# Patient Record
Sex: Male | Born: 1970 | Race: White | Hispanic: No | Marital: Single | Smoking: Former smoker
Health system: Southern US, Community
[De-identification: ages and names within clinical notes are randomized; demographics above are authoritative.]

## PROBLEM LIST (undated history)

## (undated) DIAGNOSIS — Z87891 Personal history of nicotine dependence: Secondary | ICD-10-CM

## (undated) DIAGNOSIS — G4730 Sleep apnea, unspecified: Secondary | ICD-10-CM

## (undated) DIAGNOSIS — T7840XA Allergy, unspecified, initial encounter: Secondary | ICD-10-CM

## (undated) DIAGNOSIS — F411 Generalized anxiety disorder: Secondary | ICD-10-CM

## (undated) DIAGNOSIS — R509 Fever, unspecified: Secondary | ICD-10-CM

## (undated) HISTORY — DX: Fever, unspecified: R50.9

## (undated) HISTORY — PX: WISDOM TOOTH EXTRACTION: SHX21

## (undated) HISTORY — DX: Generalized anxiety disorder: F41.1

## (undated) HISTORY — DX: Allergy, unspecified, initial encounter: T78.40XA

## (undated) HISTORY — DX: Personal history of nicotine dependence: Z87.891

## (undated) HISTORY — DX: Sleep apnea, unspecified: G47.30

## (undated) HISTORY — PX: SKIN LESION EXCISION: SHX2412

---

## 2002-07-19 ENCOUNTER — Encounter: Payer: Self-pay | Admitting: Family Medicine

## 2002-07-19 ENCOUNTER — Encounter: Admission: RE | Admit: 2002-07-19 | Discharge: 2002-07-19 | Payer: Self-pay | Admitting: Family Medicine

## 2007-01-10 ENCOUNTER — Ambulatory Visit: Payer: Self-pay | Admitting: Family Medicine

## 2007-07-19 ENCOUNTER — Ambulatory Visit: Payer: Self-pay | Admitting: Family Medicine

## 2007-10-20 ENCOUNTER — Ambulatory Visit: Payer: Self-pay | Admitting: Family Medicine

## 2008-04-30 ENCOUNTER — Ambulatory Visit: Payer: Self-pay | Admitting: Family Medicine

## 2008-05-16 ENCOUNTER — Ambulatory Visit: Payer: Self-pay | Admitting: Family Medicine

## 2008-11-26 ENCOUNTER — Ambulatory Visit: Payer: Self-pay | Admitting: Family Medicine

## 2009-01-01 ENCOUNTER — Ambulatory Visit: Payer: Self-pay | Admitting: Family Medicine

## 2009-04-08 ENCOUNTER — Ambulatory Visit: Payer: Self-pay | Admitting: Family Medicine

## 2010-03-24 ENCOUNTER — Ambulatory Visit: Payer: Self-pay | Admitting: Physician Assistant

## 2010-04-01 ENCOUNTER — Ambulatory Visit: Payer: Self-pay | Admitting: Family Medicine

## 2010-05-01 ENCOUNTER — Ambulatory Visit: Payer: Self-pay | Admitting: Family Medicine

## 2010-07-15 ENCOUNTER — Ambulatory Visit: Payer: Self-pay | Admitting: Family Medicine

## 2010-09-03 ENCOUNTER — Ambulatory Visit: Payer: Self-pay | Admitting: Family Medicine

## 2010-09-09 ENCOUNTER — Ambulatory Visit: Payer: Self-pay | Admitting: Physician Assistant

## 2010-09-24 ENCOUNTER — Ambulatory Visit: Payer: Self-pay | Admitting: Family Medicine

## 2010-10-09 ENCOUNTER — Ambulatory Visit: Payer: Self-pay | Admitting: Family Medicine

## 2010-12-02 ENCOUNTER — Ambulatory Visit: Payer: Self-pay | Admitting: Family Medicine

## 2011-03-30 ENCOUNTER — Ambulatory Visit (INDEPENDENT_AMBULATORY_CARE_PROVIDER_SITE_OTHER): Payer: 59 | Admitting: Family Medicine

## 2011-03-30 DIAGNOSIS — J309 Allergic rhinitis, unspecified: Secondary | ICD-10-CM

## 2011-03-30 DIAGNOSIS — R5381 Other malaise: Secondary | ICD-10-CM

## 2011-03-30 DIAGNOSIS — J029 Acute pharyngitis, unspecified: Secondary | ICD-10-CM

## 2011-04-20 ENCOUNTER — Ambulatory Visit (INDEPENDENT_AMBULATORY_CARE_PROVIDER_SITE_OTHER): Payer: 59 | Admitting: Medical

## 2011-04-20 DIAGNOSIS — J029 Acute pharyngitis, unspecified: Secondary | ICD-10-CM

## 2011-04-20 DIAGNOSIS — L258 Unspecified contact dermatitis due to other agents: Secondary | ICD-10-CM

## 2011-04-20 DIAGNOSIS — R109 Unspecified abdominal pain: Secondary | ICD-10-CM

## 2011-05-13 ENCOUNTER — Encounter: Payer: Self-pay | Admitting: Family Medicine

## 2011-05-28 ENCOUNTER — Ambulatory Visit (INDEPENDENT_AMBULATORY_CARE_PROVIDER_SITE_OTHER): Payer: 59 | Admitting: Medical

## 2011-05-28 ENCOUNTER — Encounter: Payer: Self-pay | Admitting: Medical

## 2011-05-28 VITALS — BP 130/98 | HR 88 | Temp 98.8°F | Ht 66.0 in | Wt 196.0 lb

## 2011-05-28 DIAGNOSIS — R21 Rash and other nonspecific skin eruption: Secondary | ICD-10-CM

## 2011-05-28 NOTE — Progress Notes (Signed)
  Subjective:   HPI  Gary Garrett is a 40 y.o. male who presents for rash on penis x 1 days.  He notes recent oral sex received from partner.  He has also shaved in the pubic region recently.  He notes yesterday felt a discomfort in the same area, but no pain or burning.  Then later yesterday noticed a round scaly lesion. He is worried for genital herpes, so he wanted to get this checked out.  He denies penile discharge currently.  No other aggravating or relieving factors.  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  He has seen specialist regarding the HPV and has had treatments for this.  No other c/o.  The following portions of the patient's history were reviewed and updated as appropriate: allergies, current medications, past family history, past medical history, past social history, past surgical history and problem list.  Review of Systems Constitutional: denies fever, chills, sweats, unexpected weight change, anorexia, fatigue Gastroenterology: denies abdominal pain, nausea, vomiting, diarrhea, constipation Musculoskeletal: denies arthralgias, myalgias, joint swelling, back pain Urology: denies dysuria, difficulty urinating, hematuria, urinary frequency, urgency     Objective:   Physical Exam  General appearance: alert, no distress, WD/WN,  Abdomen: no inguinal lymphadenopathy GU: right proximal end of penis with small 2mm round, sightly scaly round erythema lesion.  No vesicles present, no drainage.  There is slight patch of erythema, but given his recent shaving in the area, I suspect irritation vs actual viral process.   Assessment :    Encounter Diagnosis  Name Primary?  . Rash Yes    Plan:    Viral culture swab sent.  Advised that history and exam don't really favor herpes, but swab today to help eval.  Etiology most likely scab from small break in the skin where he shave in the pubic region.   Dicussed symptoms/signs of herpes, recheck if rash worsens or if he gets  similar rash or worse in same area again. For now can use OTC triple antibiotic, use condoms in general, but no sexual activity until this resolves completely.

## 2011-05-28 NOTE — Patient Instructions (Signed)
We will call with lab results   

## 2011-06-01 ENCOUNTER — Telehealth: Payer: Self-pay | Admitting: *Deleted

## 2011-06-01 LAB — HERPES SIMPLEX VIRUS CULTURE: Organism ID, Bacteria: NOT DETECTED

## 2011-06-01 NOTE — Telephone Encounter (Addendum)
Message copied by Dorthula Perfect on Mon Jun 01, 2011  4:56 PM ------      Message from: Aleen Campi, DAVID S      Created: Mon Jun 01, 2011  3:43 PM       pls call and advise that swab showed NO herpes.  Hopefully his rash has resolved by now.   Pt notified of swab results.  Rash has not resolved and pt states " I am getting in a tub of hot water with salt and soaking and it feels better."  Advised pt that if that makes the rash feel better to do just that.  Pt agreed.  CM, LPN

## 2011-06-02 ENCOUNTER — Encounter: Payer: Self-pay | Admitting: Medical

## 2011-06-02 ENCOUNTER — Ambulatory Visit: Payer: 59 | Admitting: Medical

## 2011-06-02 ENCOUNTER — Ambulatory Visit (INDEPENDENT_AMBULATORY_CARE_PROVIDER_SITE_OTHER): Payer: 59 | Admitting: Medical

## 2011-06-02 VITALS — BP 120/80 | HR 75 | Temp 98.5°F | Ht 72.0 in | Wt 194.0 lb

## 2011-06-02 DIAGNOSIS — L739 Follicular disorder, unspecified: Secondary | ICD-10-CM

## 2011-06-02 DIAGNOSIS — L738 Other specified follicular disorders: Secondary | ICD-10-CM

## 2011-06-02 DIAGNOSIS — L678 Other hair color and hair shaft abnormalities: Secondary | ICD-10-CM

## 2011-06-02 MED ORDER — MUPIROCIN 2 % EX OINT: TOPICAL_OINTMENT | CUTANEOUS | Status: DC

## 2011-06-02 NOTE — Progress Notes (Signed)
Subjective:   HPI  Gary Garrett is a 40 y.o. male who presents for rash on penis and scrotum. I saw him on 05/28/11 for similar. He initially came in with concern for rash on penis worrisome for herpes.  Viral culture came back positive.  He does shave in the genital region, and I advised that he most likely had a folliculitis at that time.  He has been doing Epsom salt soaks, and the 2 initial lesions are much improved but not completely resolved. He has a new complaint of a white bump on his left scrotum. The bump is not painful or itchy. He is just worried it may be something contagious.  No other aggravating or relieving factors.  No other c/o.  The following portions of the patient's history were reviewed and updated as appropriate: allergies, current medications, past family history, past medical history, past social history, past surgical history and problem list.   Review of Systems Constitutional: denies fever, chills, sweats, unexpected weight change, anorexia, fatigue Gastroenterology: denies abdominal pain, nausea, vomiting, diarrhea, constipation Musculoskeletal: denies arthralgias, myalgias, joint swelling, back pain Urology: denies dysuria, difficulty urinating, hematuria, urinary frequency, urgency     Objective:   Physical Exam  General appearance: alert, no distress, WD/WN,  GU: right base of penis with round erythematous 2mm lesion, similar crusted lesion on right upper inner thigh.  Left scrotum with whitish pearly papule.  Otherwise nontender, no other lesions, no lymphadenopathy, no discharge.   Assessment :    Encounter Diagnosis  Name Primary?  . Folliculitis Yes    Plan:   Advised to use mupirocin ointment on the 2 reddish lesions. These are resolving somewhat. Advised that the left scrotal lesion is benign and nothing to worry about. Reassured.  Discussed signs of STDs, worrisome genital rashes.  Advises safe sex.

## 2011-06-05 ENCOUNTER — Ambulatory Visit: Payer: 59 | Admitting: Medical

## 2011-07-27 ENCOUNTER — Encounter: Payer: Self-pay | Admitting: Medical

## 2011-07-27 ENCOUNTER — Ambulatory Visit (INDEPENDENT_AMBULATORY_CARE_PROVIDER_SITE_OTHER): Payer: 59 | Admitting: Medical

## 2011-07-27 DIAGNOSIS — W1800XA Striking against unspecified object with subsequent fall, initial encounter: Secondary | ICD-10-CM

## 2011-07-27 DIAGNOSIS — W1809XA Striking against other object with subsequent fall, initial encounter: Secondary | ICD-10-CM

## 2011-07-27 DIAGNOSIS — H9319 Tinnitus, unspecified ear: Secondary | ICD-10-CM

## 2011-07-27 NOTE — Progress Notes (Signed)
Subjective:   HPI  Gary Garrett is a 40 y.o. male who presents for c/o fall.  He was vacuuming carpet last Wednesday, 6 days ago, when he got tangled between the cough and some other furniture.  He ended up falling sideways and hit the right side of his face and body against the arm rest of the couch.  He denies LOC, headache, confusion, but ever since the fall, he has had ringing in the right ear and feeling of not being able to equalize pressure in the right ear.  He has been chewing gum and swallowing often to equalize pressure.  No other aggravating or relieving factors.  No other c/o.  The following portions of the patient's history were reviewed and updated as appropriate: allergies, current medications, past family history, past medical history, past social history, past surgical history and problem list.  Past Medical History  Diagnosis Date  . Allergy   . Asthma   . GAD (generalized anxiety disorder)    Review of Systems Constitutional: denies fever, chills, sweats Dermatology: denies rash, bruising, redness ENT: no runny nose, ear pain, sore throat, hoarseness, sinus pain Cardiology: denies chest pain, palpitations Respiratory: denies cough, shortness of breath Gastroenterology: denies abdominal pain, nausea, vomiting, no loss of bowels Musculoskeletal: denies arthralgias, myalgias, joint swelling, back pain Ophthalmology: denies vision changes Urology: denies dysuria,no loss of bladder function Neurology: no headache, weakness, tingling, numbness, confusion, amnesia     Objective:   Physical Exam  General appearance: alert, no distress, WD/WN, white male HEENT: normocephalic, sclerae anicteric, PERRLA, EOMi, TMs and canals normal, nares patent, no discharge or erythema, pharynx normal Oral cavity: MMM, no lesions Neck: supple, no lymphadenopathy, no thyromegaly, no masses, normal ROM Heart: RRR, normal S1, S2, no murmurs Lungs: CTA bilaterally, no wheezes, rhonchi, or  rales Extremities: no edema, no cyanosis, no clubbing Pulses: 2+ symmetric, upper and lower extremities, normal cap refill Neurological: alert, oriented x 3, CN2-12 intact, strength normal upper extremities and lower extremities, sensation normal throughout, DTRs 2+ throughout, no cerebellar signs, gait normal Psychiatric: normal affect, behavior normal, pleasant    Assessment :    Encounter Diagnoses  Name Primary?  . Tinnitus Yes  . Fall against object      Plan:   Based on exam and symptoms today, I don't suspect intracranial bleed or concussion.   Advised he rest, including keeping stimuli to minimal the next few days, avoid sudden movements, use OTC Aleve and Dramamine the next few days, and see if symptoms don't gradually resolve.  Advised if new symptoms - worse headache of life, vision changes, numbness, weakness, pupil asymmetry, confusion, etc., to call/return/or go to the ED.  Advised if not some better by end of the week to call back.

## 2011-07-28 ENCOUNTER — Encounter: Payer: Self-pay | Admitting: Family Medicine

## 2011-09-30 ENCOUNTER — Ambulatory Visit (INDEPENDENT_AMBULATORY_CARE_PROVIDER_SITE_OTHER): Payer: 59 | Admitting: Medical

## 2011-09-30 ENCOUNTER — Encounter: Payer: Self-pay | Admitting: Medical

## 2011-09-30 ENCOUNTER — Other Ambulatory Visit: Payer: Self-pay | Admitting: Medical

## 2011-09-30 DIAGNOSIS — F17200 Nicotine dependence, unspecified, uncomplicated: Secondary | ICD-10-CM

## 2011-09-30 DIAGNOSIS — Z113 Encounter for screening for infections with a predominantly sexual mode of transmission: Secondary | ICD-10-CM

## 2011-09-30 DIAGNOSIS — F411 Generalized anxiety disorder: Secondary | ICD-10-CM

## 2011-09-30 DIAGNOSIS — J029 Acute pharyngitis, unspecified: Secondary | ICD-10-CM

## 2011-09-30 DIAGNOSIS — Z23 Encounter for immunization: Secondary | ICD-10-CM

## 2011-09-30 DIAGNOSIS — Z0000 Encounter for general adult medical examination without abnormal findings: Secondary | ICD-10-CM | POA: Insufficient documentation

## 2011-09-30 DIAGNOSIS — F419 Anxiety disorder, unspecified: Secondary | ICD-10-CM | POA: Insufficient documentation

## 2011-09-30 LAB — CBC WITH DIFFERENTIAL/PLATELET
Basophils Absolute: 0 10*3/uL (ref 0.0–0.1)
Basophils Relative: 1 % (ref 0–1)
Eosinophils Absolute: 0.1 10*3/uL (ref 0.0–0.7)
Eosinophils Relative: 3 % (ref 0–5)
HCT: 42.6 % (ref 39.0–52.0)
Lymphocytes Relative: 39 % (ref 12–46)
Lymphs Abs: 1.5 10*3/uL (ref 0.7–4.0)
MCH: 29.2 pg (ref 26.0–34.0)
MCHC: 32.4 g/dL (ref 30.0–36.0)
MCV: 90.3 fL (ref 78.0–100.0)
Monocytes Absolute: 0.5 10*3/uL (ref 0.1–1.0)
Monocytes Relative: 13 % — ABNORMAL HIGH (ref 3–12)
Platelets: 247 10*3/uL (ref 150–400)
RBC: 4.72 MIL/uL (ref 4.22–5.81)
RDW: 13.7 % (ref 11.5–15.5)
WBC: 3.9 10*3/uL — ABNORMAL LOW (ref 4.0–10.5)

## 2011-09-30 LAB — POCT URINALYSIS DIPSTICK
Blood, UA: NEGATIVE
Glucose, UA: NEGATIVE
Ketones, UA: NEGATIVE
Spec Grav, UA: 1.015
Urobilinogen, UA: NEGATIVE

## 2011-09-30 NOTE — Patient Instructions (Signed)
Preventative Care for Adults, Male       REGULAR HEALTH EXAMS:  A routine yearly physical is a good way to check in with your primary care provider about your health and preventive screening. It is also an opportunity to share updates about your health and any concerns you have, and receive a thorough all-over exam.   Most health insurance companies pay for at least some preventative services.  Check with your health plan for specific coverages.  WHAT PREVENTATIVE SERVICES DO MEN NEED?  Adult men should have their weight and blood pressure checked regularly.   Men age 35 and older should have their cholesterol levels checked regularly.  Beginning at age 50 and continuing to age 75, men should be screened for colorectal cancer.  Certain people should may need continued testing until age 85.  Other cancer screening may include exams for testicular and prostate cancer.  Updating vaccinations is part of preventative care.  Vaccinations help protect against diseases such as the flu.  Lab tests are generally done as part of preventative care to screen for anemia and blood disorders, to screen for problems with the kidneys and liver, to screen for bladder problems, to check blood sugar, and to check your cholesterol level.  Preventative services generally include counseling about diet, exercise, avoiding tobacco, drugs, excessive alcohol consumption, and sexually transmitted infections.    GENERAL RECOMMENDATIONS FOR GOOD HEALTH:  Healthy diet:  Eat a variety of foods, including fruit, vegetables, animal or vegetable protein, such as meat, fish, chicken, and eggs, or beans, lentils, tofu, and grains, such as rice.  Drink plenty of water daily.  Decrease saturated fat in the diet, avoid lots of red meat, processed foods, sweets, fast foods, and fried foods.  Exercise:  Aerobic exercise helps maintain good heart health. At least 30-40 minutes of moderate-intensity exercise is recommended.  For example, a brisk walk that increases your heart rate and breathing. This should be done on most days of the week.   Find a type of exercise or a variety of exercises that you enjoy so that it becomes a part of your daily life.  Examples are running, walking, swimming, water aerobics, and biking.  For motivation and support, explore group exercise such as aerobic class, spin class, Zumba, Yoga,or  martial arts, etc.    Set exercise goals for yourself, such as a certain weight goal, walk or run in a race such as a 5k walk/run.  Speak to your primary care provider about exercise goals.  Disease prevention:  If you smoke or chew tobacco, find out from your caregiver how to quit. It can literally save your life, no matter how long you have been a tobacco user. If you do not use tobacco, never begin.   Maintain a healthy diet and normal weight. Increased weight leads to problems with blood pressure and diabetes.   The Body Mass Index or BMI is a way of measuring how much of your body is fat. Having a BMI above 27 increases the risk of heart disease, diabetes, hypertension, stroke and other problems related to obesity. Your caregiver can help determine your BMI and based on it develop an exercise and dietary program to help you achieve or maintain this important measurement at a healthful level.  High blood pressure causes heart and blood vessel problems.  Persistent high blood pressure should be treated with medicine if weight loss and exercise do not work.   Fat and cholesterol leaves deposits in your arteries   that can block them. This causes heart disease and vessel disease elsewhere in your body.  If your cholesterol is found to be high, or if you have heart disease or certain other medical conditions, then you may need to have your cholesterol monitored frequently and be treated with medication.   Ask if you should have a stress test if your history suggests this. A stress test is a test done on  a treadmill that looks for heart disease. This test can find disease prior to there being a problem.  Avoid drinking alcohol in excess (more than two drinks per day).  Avoid use of street drugs. Do not share needles with anyone. Ask for professional help if you need assistance or instructions on stopping the use of alcohol, cigarettes, and/or drugs.  Brush your teeth twice a day with fluoride toothpaste, and floss once a day. Good oral hygiene prevents tooth decay and gum disease. The problems can be painful, unattractive, and can cause other health problems. Visit your dentist for a routine oral and dental check up and preventive care every 6-12 months.   Look at your skin regularly.  Use a mirror to look at your back. Notify your caregivers of changes in moles, especially if there are changes in shapes, colors, a size larger than a pencil eraser, an irregular border, or development of new moles.  Safety:  Use seatbelts 100% of the time, whether driving or as a passenger.  Use safety devices such as hearing protection if you work in environments with loud noise or significant background noise.  Use safety glasses when doing any work that could send debris in to the eyes.  Use a helmet if you ride a bike or motorcycle.  Use appropriate safety gear for contact sports.  Talk to your caregiver about gun safety.  Use sunscreen with a SPF (or skin protection factor) of 15 or greater.  Lighter skinned people are at a greater risk of skin cancer. Don't forget to also wear sunglasses in order to protect your eyes from too much damaging sunlight. Damaging sunlight can accelerate cataract formation.   Practice safe sex. Use condoms. Condoms are used for birth control and to help reduce the spread of sexually transmitted infections (or STIs).  Some of the STIs are gonorrhea (the clap), chlamydia, syphilis, trichomonas, herpes, HPV (human papilloma virus) and HIV (human immunodeficiency virus) which causes AIDS.  The herpes, HIV and HPV are viral illnesses that have no cure. These can result in disability, cancer and death.   Keep carbon monoxide and smoke detectors in your home functioning at all times. Change the batteries every 6 months or use a model that plugs into the wall.   Vaccinations:  Stay up to date with your tetanus shots and other required immunizations. You should have a booster for tetanus every 10 years. Be sure to get your flu shot every year, since 5%-20% of the [place] population comes down with the flu. The flu vaccine changes each year, so being vaccinated once is not enough. Get your shot in the fall, before the flu season peaks.   Other vaccines to consider:  Pneumococcal vaccine to protect against certain types of pneumonia.  This is normally recommended for adults age 65 or older.  However, adults younger than 40 years old with certain underlying conditions such as diabetes, heart or lung disease should also receive the vaccine.  Shingles vaccine to protect against Varicella Zoster if you are older than age 60, or younger   than 40 years old with certain underlying illness.  Hepatitis A vaccine to protect against a form of infection of the liver by a virus acquired from food.  Hepatitis B vaccine to protect against a form of infection of the liver by a virus acquired from blood or body fluids, particularly if you work in health care.  If you plan to travel internationally, check with your local health department for specific vaccination recommendations.  Cancer Screening:  Most routine colon cancer screening begins at the age of 50. On a yearly basis, doctors may provide special easy to use take-home tests to check for hidden blood in the stool. Sigmoidoscopy or colonoscopy can detect the earliest forms of colon cancer and is life saving. These tests use a small camera at the end of a tube to directly examine the colon. Speak to your caregiver about this at age 50, when routine  screening begins (and is repeated every 5 years unless early forms of pre-cancerous polyps or small growths are found).   At the age of 50 men usually start screening for prostate cancer every year. Screening may begin at a younger age for those with higher risk. Those at higher risk include African-Americans or having a family history of prostate cancer. There are two types of tests for prostate cancer:   Prostate-specific antigen (PSA) testing. Recent studies raise questions about prostate cancer using PSA and you should discuss this with your caregiver.   Digital rectal exam (in which your doctor's lubricated and gloved finger feels for enlargement of the prostate through the anus).   Screening for testicular cancer.  Do a monthly exam of your testicles. Gently roll each testicle between your thumb and fingers, feeling for any abnormal lumps. The best time to do this is after a hot shower or bath when the tissues are looser. Notify your caregivers of any lumps, tenderness or changes in size or shape immediately.     

## 2011-09-30 NOTE — Progress Notes (Signed)
Subjective:   HPI  Gary Garrett is a 40 y.o. male who presents for a complete physical.  He has been doing well in general.  He notes last tetanus booster within the last 10 years at [facility].  He wants flu shot today.  He has hx/o Hep A and Hep B vaccinations.  He wants STD screening today.  He does have 29 male sexual partners and has had hx/o +gonorrhea throat culture.  Has had recent sore throat, but does have hx/o allergies.  He also notes hx/o genital warts surgically removed by [facility].   Hx/o hemorrhoids and fissure.  No other c/o.  He does deal with anxiety.  Has used Lexapro in the past without improvement.  Declines medication today.    Reviewed their medical, surgical, family, social, medication, and allergy history and updated chart as appropriate.  Past Medical History  Diagnosis Date  . Allergy   . GAD (generalized anxiety disorder)   . Asthma     allergen induced asthma  . 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 No pertinent past surgical history.  Family History  Problem Relation Age of Onset  . Hypertension Paternal Grandmother   . Heart disease Paternal Grandfather     died in 33s of heart disease  . Stroke Neg Hx   . Diabetes Neg Hx   . Cancer Other     great grandmother lung cancer    History   Social History  . Marital Status: Single    Spouse Name: N/A    Number of Children: N/A  . Years of Education: N/A   Occupational History  . landscape architecture    Social History Main Topics  . Smoking status: Current Some Day Smoker -- 0.2 packs/day  . Smokeless tobacco: Never Used  . Alcohol Use: 0.0 oz/week    0 drink(s) per week  . Drug Use: No  . Sexually Active: Not on file   Other Topics Concern  . Not on file   Social History Narrative   Lives at home by himself, single.  Exercise - not much.   Has multiple sex partners, men, uses condoms with anal intercourse.    Current  Outpatient Prescriptions on File Prior to Visit  Medication Sig Dispense Refill  . Ascorbic Acid (VITAMIN C) 1000 MG tablet Take 1,000 mg by mouth daily.          No Known Allergies   Review of Systems Constitutional: denies fever, chills, sweats, unexpected weight change, anorexia, fatigue Allergy: negative; denies recent sneezing, itching, congestion Dermatology: denies changing moles, rash, lumps, new worrisome lesions ENT: +irritated throat; no runny nose, ear pain, hoarseness, sinus pain, teeth pain, tinnitus, hearing loss, epistaxis Cardiology: +occasional palpitation with anxiety/panic attack; denies chest pain, edema, orthopnea, paroxysmal nocturnal dyspnea Respiratory: denies cough, shortness of breath, dyspnea on exertion, wheezing, hemoptysis Gastroenterology: denies abdominal pain, nausea, vomiting, diarrhea, constipation, blood in stool, changes in bowel movement, dysphagia Hematology: denies bleeding or bruising problems Musculoskeletal: denies arthralgias, myalgias, joint swelling, back pain, neck pain, cramping, gait changes Ophthalmology: denies vision changes, eye redness, itching, discharge Urology: +urinary irritation; denies dysuria, difficulty urinating, hematuria, incontinence Neurology: no headache, weakness, tingling, numbness, speech abnormality, memory loss, falls, dizziness Psychology: +anxiety; denies depressed mood, sleep problems     Objective:   Physical Exam  Filed Vitals:   09/30/11 0824  BP: 110/80  Pulse: 62  Temp: 98 F (  36.7 C)  Resp: 16    General appearance: alert, no distress, WD/WN, white male Skin: left upper arm laterally with large patch of brown/pink flat macular lesions that he notes is scar from prior poison ivy dermatitis, scattered benign appearing macules on back and arms HEENT: normocephalic, conjunctiva/corneas normal, sclerae anicteric, PERRLA, EOMi, nares patent, no discharge or erythema, pharynx normal Oral cavity: MMM,  tongue normal, teeth in good repair Neck: supple, no lymphadenopathy, no thyromegaly, no masses, normal ROM, no bruits Chest: non tender, normal shape and expansion Heart: RRR, normal S1, S2, no murmurs Lungs: CTA bilaterally, no wheezes, rhonchi, or rales Abdomen: +bs, soft, non tender, non distended, no masses, no hepatomegaly, no splenomegaly, no bruits Back: non tender, normal ROM, no scoliosis Musculoskeletal: upper extremities non tender, no obvious deformity, normal ROM throughout, lower extremities non tender, no obvious deformity, normal ROM throughout Extremities: no edema, no cyanosis, no clubbing Pulses: 2+ symmetric, upper and lower extremities, normal cap refill Neurological: alert, oriented x 3, CN2-12 intact, strength normal upper extremities and lower extremities, sensation normal throughout, DTRs 2+ throughout, no cerebellar signs, gait normal Psychiatric: normal affect, behavior normal, pleasant  GU: normal male external genitalia, no mass, no lesions, no lymphadenopathy Anus: normal appearing, no hemorrhoid or lesion   Assessment :    Encounter Diagnoses  Name Primary?  . General medical examination Yes  . Screen for STD (sexually transmitted disease)   . Anxiety   . Tobacco use disorder   . Pharyngitis   . Need for prophylactic vaccination and inoculation against influenza      Plan:    Physical exam - discussed healthy lifestyle, diet, exercise, preventative care, vaccinations, and addressed their concerns.    STD screening - high risk sexual behavior.  Labs sent. Discussed safe sex, use of condoms.    Anxiety - discussed concerns, ways to deal with anxiety.  Advised regular exercise.    Tobacco use disorder - discussed risks/benefits of tobacco use.  Advised he stop tobacco.  Pharyngitis - likely due to allergens.  Begin OTC Zyrtec.  Given his sexual contacts with oral sex and hx/o gonorrhea + on prior throat culture, we will screen today for gonorrhea,  chlamydia, and throat culture.  Flu vaccine and VIS today.

## 2011-10-01 LAB — COMPREHENSIVE METABOLIC PANEL
AST: 16 U/L (ref 0–37)
Albumin: 4.8 g/dL (ref 3.5–5.2)
Alkaline Phosphatase: 46 U/L (ref 39–117)
CO2: 25 mEq/L (ref 19–32)
Chloride: 108 mEq/L (ref 96–112)
Creat: 0.84 mg/dL (ref 0.50–1.35)
Glucose, Bld: 93 mg/dL (ref 70–99)
Potassium: 4.4 mEq/L (ref 3.5–5.3)
Sodium: 141 mEq/L (ref 135–145)
Total Bilirubin: 1 mg/dL (ref 0.3–1.2)

## 2011-10-01 LAB — HIV ANTIBODY (ROUTINE TESTING W REFLEX): HIV: NONREACTIVE

## 2011-10-01 LAB — LIPID PANEL
HDL: 38 mg/dL — ABNORMAL LOW (ref >39)
LDL Cholesterol: 91 mg/dL (ref 0–99)
Total CHOL/HDL Ratio: 3.7 Ratio
Triglycerides: 64 mg/dL (ref <150)
VLDL: 13 mg/dL (ref 0–40)

## 2011-10-01 LAB — GC/CHLAMYDIA PROBE AMP, URINE: Chlamydia, Swab/Urine, PCR: NEGATIVE

## 2011-10-03 LAB — GONOCOCCUS CULTURE: Organism ID, Bacteria: NO GROWTH

## 2012-03-18 ENCOUNTER — Encounter: Payer: Self-pay | Admitting: Medical

## 2012-03-18 ENCOUNTER — Ambulatory Visit (INDEPENDENT_AMBULATORY_CARE_PROVIDER_SITE_OTHER): Payer: 59 | Admitting: Medical

## 2012-03-18 VITALS — BP 114/80 | HR 84 | Temp 98.6°F | Resp 16 | Wt 201.0 lb

## 2012-03-18 DIAGNOSIS — J029 Acute pharyngitis, unspecified: Secondary | ICD-10-CM

## 2012-03-18 LAB — POCT RAPID STREP A (OFFICE): Rapid Strep A Screen: NEGATIVE

## 2012-03-18 NOTE — Progress Notes (Signed)
Subjective:  Gary Garrett is a 41 y.o. male who presents for 1 week hx/o sore throat.  He reports sore throat ranging from mild to moderate pain, some irritated eyes, mild cough, was around some pollen in [place] earlier in the week.  Using nothing for symptoms.  He has not had a recent close exposure to someone with proven streptococcal pharyngitis.  Past Medical History  Diagnosis Date  . Allergy   . GAD (generalized anxiety disorder)   . Asthma     allergen induced asthma  . 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  Oral cavity: MMM, no lesions  Neck: supple, no lymphadenopathy, no thyromegaly Heart: RRR, normal S1, S2, no murmurs Lungs: CTA bilaterally, no wheezes, rhonchi, or rales  Laboratory Strep test done. Results:negative.    Assessment and Plan:   Encounter Diagnoses  Name Primary?  . Pharyngitis Yes  . Sore throat     Advised that symptoms and exam suggest viral etiology.  Discussed symptomatic treatment including salt water gargles, warm fluids, rest, hydrate well, can use over-the-counter Tylenol or Ibuprofen for throat pain, fever, or malaise. If worse or not improving within 2-3 days, call or return.

## 2012-03-18 NOTE — Patient Instructions (Signed)
Warm fluids, salt water gargles, honey/lemon mixture, sore throat spray OTC, Zyrtec 10mg  at bedtime daily (OTC), and Ibuprofen 200mg  OTC, use 2-3 tablets every 6 hours as needed for throat pain over the next few days.

## 2012-03-25 ENCOUNTER — Other Ambulatory Visit: Payer: Self-pay | Admitting: Medical

## 2012-03-25 ENCOUNTER — Telehealth: Payer: Self-pay | Admitting: Medical

## 2012-03-25 MED ORDER — AZITHROMYCIN 500 MG PO TABS: 500.0000 mg | ORAL_TABLET | Freq: Every day | ORAL | Status: AC

## 2012-03-25 NOTE — Telephone Encounter (Signed)
Pt states the he came in last Friday and saw you.  Pt states that he is some better. He does state that his throat is still just raw and causing him a lot of discomfort. Please call pt. [facility] uses [facility] [address].

## 2012-03-25 NOTE — Telephone Encounter (Signed)
Given the ongoing throat pain, lets try antibiotic for possible infection.   I sent Azithromycin, one daily for 3 days.

## 2012-03-25 NOTE — Telephone Encounter (Signed)
Lmom notifying the patient that a antibotic was sent to the pharmacy. CLS

## 2012-04-06 ENCOUNTER — Ambulatory Visit (INDEPENDENT_AMBULATORY_CARE_PROVIDER_SITE_OTHER): Payer: 59 | Admitting: Medical

## 2012-04-06 ENCOUNTER — Encounter: Payer: Self-pay | Admitting: Medical

## 2012-04-06 VITALS — BP 118/80 | HR 76 | Temp 97.8°F | Resp 16 | Wt 201.0 lb

## 2012-04-06 DIAGNOSIS — T148 Other injury of unspecified body region: Secondary | ICD-10-CM

## 2012-04-06 DIAGNOSIS — L089 Local infection of the skin and subcutaneous tissue, unspecified: Secondary | ICD-10-CM

## 2012-04-06 DIAGNOSIS — W57XXXA Bitten or stung by nonvenomous insect and other nonvenomous arthropods, initial encounter: Secondary | ICD-10-CM

## 2012-04-06 DIAGNOSIS — T148XXA Other injury of unspecified body region, initial encounter: Secondary | ICD-10-CM

## 2012-04-06 MED ORDER — DOXYCYCLINE HYCLATE 100 MG PO TABS: 100.0000 mg | ORAL_TABLET | Freq: Two times a day (BID) | ORAL | Status: AC
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
## 2012-04-19 ENCOUNTER — Telehealth (INDEPENDENT_AMBULATORY_CARE_PROVIDER_SITE_OTHER): Payer: Self-pay | Admitting: Surgery

## 2012-04-21 ENCOUNTER — Encounter (INDEPENDENT_AMBULATORY_CARE_PROVIDER_SITE_OTHER): Payer: Self-pay | Admitting: Surgery

## 2012-04-26 ENCOUNTER — Ambulatory Visit (INDEPENDENT_AMBULATORY_CARE_PROVIDER_SITE_OTHER): Payer: 59 | Admitting: Surgery

## 2012-04-26 ENCOUNTER — Encounter (INDEPENDENT_AMBULATORY_CARE_PROVIDER_SITE_OTHER): Payer: Self-pay | Admitting: Surgery

## 2012-04-26 VITALS — BP 122/84 | HR 88 | Temp 97.8°F | Resp 16 | Ht 72.0 in | Wt 198.8 lb

## 2012-04-26 DIAGNOSIS — L290 Pruritus ani: Secondary | ICD-10-CM | POA: Insufficient documentation

## 2012-04-26 NOTE — Progress Notes (Signed)
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  He was last seen in October 2011.  For some time he has been completely asymptomatic.  However, as the weather has warmed up, he has developed more itching and "moisture" in the perianal region.  No masses palpated.  No melena or hematochezia.  Occasional blood on toilet paper.  No significant pain.  He is only using some over the counter ointments.  Filed Vitals:   04/26/12 1559  BP: 122/84  Pulse: 88  Temp: 97.8 F (36.6 C)  Resp: 16    WDWN in NAD Rectal - no sign of any external condyloma lesions. No erythema, induration, excoriation No fissure, hemorrhoids, or fistula Essentially normal perirectal examination Normal sphincter tone No internal hemorrhoids on DRE  Imp:  Pruritus ani with normal rectal examination Recs:  Keep the perianal skin as dry as possible - minimize the use of ointments Use fiber supplements to maintain the consistency of the bowel movements Follow-up PRN.  Wilmon Arms. Corliss Skains, MD, [facility]  04/26/2012 4:59 PM

## 2012-05-11 ENCOUNTER — Encounter (INDEPENDENT_AMBULATORY_CARE_PROVIDER_SITE_OTHER): Payer: Self-pay | Admitting: Surgery

## 2012-07-08 ENCOUNTER — Ambulatory Visit (INDEPENDENT_AMBULATORY_CARE_PROVIDER_SITE_OTHER): Payer: 59 | Admitting: Medical

## 2012-07-08 ENCOUNTER — Encounter: Payer: Self-pay | Admitting: Medical

## 2012-07-08 VITALS — BP 124/80 | HR 74 | Temp 97.2°F | Resp 16 | Wt 203.0 lb

## 2012-07-08 DIAGNOSIS — J029 Acute pharyngitis, unspecified: Secondary | ICD-10-CM

## 2012-07-08 DIAGNOSIS — L0100 Impetigo, unspecified: Secondary | ICD-10-CM

## 2012-07-08 DIAGNOSIS — R21 Rash and other nonspecific skin eruption: Secondary | ICD-10-CM

## 2012-07-08 MED ORDER — DIPHENHYD-HYDROCORT-NYSTATIN MT SUSP: 10.0000 mL | Freq: Four times a day (QID) | OROMUCOSAL | Status: DC

## 2012-07-08 MED ORDER — MUPIROCIN 2 % EX OINT: TOPICAL_OINTMENT | CUTANEOUS | Status: DC

## 2012-07-08 MED ORDER — TRIAMCINOLONE ACETONIDE 0.1 % EX CREA: TOPICAL_CREAM | Freq: Two times a day (BID) | CUTANEOUS | Status: DC

## 2012-07-08 NOTE — Progress Notes (Signed)
Subjective: Here for multiple c/o.   Has rash under right arm for about a month.  Has been using hydrocortisone cream and neosporin, but rash won't go away.   Angelique Blonder any other exposure, no contacts with similar rash.  Only has rash under right armpit.   He reports a week and a half of throat irritation.  Wants some [employer] Magic Mouthwash that he uses occasional to gargle.  Says this helps.  He works at [employer], around plants all the time.  He c/o sore in left nostril, starting to clear up.  ROS as noted above in HPI  Objective: Gen: wd, wn, nad Heent: TMs pearly, left nare with small round erythematous and crusting lesion, otherwise no lesions, pharynx with mild erythema, some mucous drainage Neck: nontneder, no lymphadenopathy, no mass Skin: right axillar with scattered patches of erythema, no drainage, induration, fluctuance or warmth, nonspecific rash  Assessment: Encounter Diagnoses  Name Primary?  . Rash and nonspecific skin eruption Yes  . Pharyngitis   . Impetigo    Plan: Rash - suspect neomycin/neosporin allergy.  Stop neosporin, begin Triamcinolone cream, call if not resolving.  Pharyngitis - salt water gargles, warm fluids, begin back on zyrtec for a few weeks.  Magic mouthwash at his request  Impetigo in nose - mupirocin script.  return prn.

## 2012-07-22 ENCOUNTER — Other Ambulatory Visit: Payer: Self-pay | Admitting: Medical

## 2012-07-22 ENCOUNTER — Ambulatory Visit (INDEPENDENT_AMBULATORY_CARE_PROVIDER_SITE_OTHER): Payer: 59 | Admitting: Medical

## 2012-07-22 ENCOUNTER — Encounter: Payer: Self-pay | Admitting: Medical

## 2012-07-22 VITALS — BP 120/78 | HR 80 | Temp 98.5°F | Resp 16 | Wt 206.0 lb

## 2012-07-22 DIAGNOSIS — J029 Acute pharyngitis, unspecified: Secondary | ICD-10-CM

## 2012-07-22 DIAGNOSIS — J349 Unspecified disorder of nose and nasal sinuses: Secondary | ICD-10-CM

## 2012-07-22 DIAGNOSIS — H109 Unspecified conjunctivitis: Secondary | ICD-10-CM

## 2012-07-22 DIAGNOSIS — J329 Chronic sinusitis, unspecified: Secondary | ICD-10-CM

## 2012-07-22 MED ORDER — DOXYCYCLINE HYCLATE 100 MG PO TABS: 100.0000 mg | ORAL_TABLET | Freq: Two times a day (BID) | ORAL | Status: AC

## 2012-07-22 NOTE — Progress Notes (Signed)
Subjective: Here for c/o sore throat.   Saw me a few weeks ago for the same.  Still using Zyrtec but not helping much.  He currently reports sore throat, fatigue, no energy, eyes feel like a film of pollen is on them.  Otherwise no other symptoms.  He does report new sexual contact a few weeks ago, performed oral sex and he wants to make sure he doesn't have an STD causing the sore throat.    Past Medical History  Diagnosis Date  . Allergy   . GAD (generalized anxiety disorder)   . Asthma     allergen induced asthma  . 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 Tick bite of buttock 04/06/12   ROS Gen: no fever, chills, weight loss Eyes: itching bilat Skin: no rash Heent: sinus pressure, runny nose, sneezing, sore throat Lungs: no SOB or wheezing Heart: no CP, palpations GI: negative GU : negative  Objective: Gen: wd, wn, nad Skin: unremarkable Neck: nontender, shoddy lymph nodes, no mass or thyromegaly HEENT: sinus nontender, TMs pearly, nares patent, pharynx with mild erythema, no exudate Lungs: CTA   Assessment: Encounter Diagnoses  Name Primary?  . Pharyngitis Yes  . Sinus problem   . Conjunctivitis   . Sore throat    Plan: symptoms most consistent with sinusitis, conjunctivitis and post nasal drainage causing pharyngitis.    Begin Doxycyline.  Hydrate well, rest, use salt water gargles, warm fluids.  Given concern for STD, will check throat culture, gonorrhea and chlamydia throat cultures.   Begin OTC allergy eye drop.  If worse or not improving, call or return.

## 2012-07-27 ENCOUNTER — Telehealth: Payer: Self-pay | Admitting: Medical

## 2012-07-28 LAB — CHLAMYDIA CULTURE

## 2012-07-28 NOTE — Telephone Encounter (Signed)
LABS ARE NOT READY YET. PER JO! CLS

## 2013-02-20 ENCOUNTER — Encounter: Payer: Self-pay | Admitting: Medical

## 2013-02-20 ENCOUNTER — Ambulatory Visit (INDEPENDENT_AMBULATORY_CARE_PROVIDER_SITE_OTHER): Payer: 59 | Admitting: Medical

## 2013-02-20 VITALS — BP 120/82 | HR 78 | Temp 98.3°F | Resp 16 | Wt 204.0 lb

## 2013-02-20 DIAGNOSIS — R300 Dysuria: Secondary | ICD-10-CM

## 2013-02-20 DIAGNOSIS — N419 Inflammatory disease of prostate, unspecified: Secondary | ICD-10-CM

## 2013-02-20 LAB — POCT URINALYSIS DIPSTICK
Bilirubin, UA: NEGATIVE
Blood, UA: NEGATIVE
Nitrite, UA: NEGATIVE
Urobilinogen, UA: NEGATIVE
pH, UA: 7

## 2013-02-20 MED ORDER — DOXYCYCLINE HYCLATE 100 MG PO TABS: 100.0000 mg | ORAL_TABLET | Freq: Two times a day (BID) | ORAL | Status: DC
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
## 2013-02-20 NOTE — Addendum Note (Signed)
Addended by: Janeice Robinson on: 02/20/2013 10:22 AM   Modules accepted: Orders

## 2013-02-20 NOTE — Patient Instructions (Signed)
Prostatitis  The prostate gland is about the size and shape of a walnut. It is located just below your bladder. It produces one of the components of semen, which is made up of sperm and the fluids that help nourish and transport it out from the testicles. Prostatitis is redness, soreness, and swelling (inflammation) of the prostate gland.   There are 3 types of prostatitis:   Acute bacterial prostatitis This is the least common type of prostatitis. It starts quickly and usually leads to a bladder infection. It can occur at any age.   Chronic bacterial prostatitis This is a persistent bacterial infection in the prostate.It usually develops from repeated acute bacterial prostatitis or acute bacterial prostatitis that was not properly treated. It can occur in men of any age but is most common in middle-aged men whose prostate has begun to enlarge.   Chronic prostatitis chronic pelvic pain syndrome This is the most common type of prostatitis. It is inflammation of the prostate gland that is not caused by a bacterial infection. The cause is unknown.  CAUSES  The cause of acute and chronic bacterial prostatitis is a bacterial infection. The exact cause of chronic prostatitis and chronic pelvic pain syndrome and asymptomatic inflammatory prostatitis is unknown.   SYMPTOMS   Symptoms can vary depending upon the type of prostatitis that exists. There can also be overlap in symptoms. Possible symptoms for each type of prostatitis are listed below.  Acute bacterial prostatitis   Painful urination.   Fever or chills.   Muscle or joint pains.   Low back pain.   Low abdominal pain.   Inability to empty bladder completely.   Sudden urge to urinate.   Frequent urination.   Difficulty starting urine stream.   Weak urine stream.   Discharge from the urethra.   Dribbling after urination.   Rectal pain.   Pain in the testicles, penis, or tip of the penis.   Pain in the space between the anus and scrotum  (perineum).   Problems with sexual function.   Painful ejaculation.   Bloody semen.  Chronic bacterial prostatitis   The symptoms are similar to those of acute bacterial prostatitis, but they usually are much less severe. Fever, chills, and muscle and joint pain are not associated with chronic bacterial prostatitis.  Chronic prostatitis chronic pelvic pain syndrome   Symptoms typically include a dull ache in the scrotum and the perineum.  DIAGNOSIS   In order to diagnose prostatitis, your caregiver will ask about your symptoms. If acute or chronic bacterial prostatitis is suspected, a urine sample will be taken and tested (urinalysis). This is to see if there is bacteria in your urine. If the urinalysis result is negative for bacteria, your caregiver may use a finger to feel your prostate (digital rectal exam). This exam helps your caregiver determine if your prostate is swollen and tender.  TREATMENT   Treatment for prostatitis depends on the cause. If a bacterial infection is the cause, it can be treated with antibiotic medicine. In cases of chronic bacterial prostatitis, the use of antibiotics for up to 1 month may be necessary. Your caregiver may instruct you to take sitz baths to help relieve pain. A sitz bath is a bath of hot water in which your hips and buttocks are under water.  HOME CARE INSTRUCTIONS    Take all medicines as directed by your caregiver.   Take sitz baths as directed by your caregiver.  SEEK MEDICAL CARE IF:      Your symptoms get worse, not better.   You have a fever.  SEEK IMMEDIATE MEDICAL CARE IF:    You have chills.   You feel nauseous or vomit.   You feel lightheaded or faint.   You are unable to urinate.   You have blood or blood clots in your urine.  Document Released: 11/27/2000 Document Revised: 02/22/2012 Document Reviewed: 11/02/2011  [employer] Patient Information 2013 [employer].

## 2013-02-21 LAB — GC/CHLAMYDIA PROBE AMP, URINE: Chlamydia, Swab/Urine, PCR: NEGATIVE

## 2013-02-22 LAB — URINE CULTURE: Colony Count: 7000

## 2013-02-27 ENCOUNTER — Ambulatory Visit: Payer: 59 | Admitting: Medical

## 2013-02-27 ENCOUNTER — Encounter: Payer: Self-pay | Admitting: Medical

## 2013-02-27 VITALS — BP 110/80 | HR 68 | Temp 98.5°F | Resp 16 | Wt 197.0 lb

## 2013-02-27 DIAGNOSIS — A088 Other specified intestinal infections: Secondary | ICD-10-CM

## 2013-02-27 DIAGNOSIS — A084 Viral intestinal infection, unspecified: Secondary | ICD-10-CM

## 2013-02-27 DIAGNOSIS — R112 Nausea with vomiting, unspecified: Secondary | ICD-10-CM

## 2013-02-27 MED ORDER — PROMETHAZINE HCL 25 MG PO TABS: 25.0000 mg | ORAL_TABLET | Freq: Four times a day (QID) | ORAL | Status: DC | PRN

## 2013-02-27 NOTE — Patient Instructions (Signed)
Viral Gastroenteritis Viral gastroenteritis is also known as stomach flu. This condition affects the stomach and intestinal tract. The illness typically lasts 3 to 8 days. Most people develop an immune response. This eventually gets rid of the virus. While this natural response develops, the virus can make you quite ill.  CAUSES  Diarrhea and vomiting are often caused by a virus. Medicines (antibiotics) that kill germs will not help unless there is also a germ (bacterial) infection. SYMPTOMS  The most common symptom is diarrhea. This can cause severe loss of fluids (dehydration) and body salt (electrolyte) imbalance. TREATMENT  Treatments for this illness are aimed at rehydration. Antidiarrheal medicines are not recommended. They do not decrease diarrhea volume and may be harmful. Usually, home treatment is all that is needed. The most serious cases involve vomiting so severely that you are not able to keep down fluids taken by mouth (orally). In these cases, intravenous (IV) fluids are needed. Vomiting with viral gastroenteritis is common, but it will usually go away with treatment. HOME CARE INSTRUCTIONS  Small amounts of fluids should be taken frequently. Large amounts at one time may not be tolerated. Plain water may be harmful in infants and the elderly. Oral rehydration solutions (ORS) are available at pharmacies and grocery stores. ORS replace water and important electrolytes in proper proportions. Sports drinks are not as effective as ORS and may be harmful due to sugars worsening diarrhea.  As a general guideline for children, replace any new fluid losses from diarrhea or vomiting with ORS as follows:   If your child weighs 22 pounds or under (10 kg or less), give 60-120 mL (1/4 - 1/2 cup or 2 - 4 ounces) of ORS for each diarrheal stool or vomiting episode.   If your child weighs more than 22 pounds (more than 10 kgs), give 120-240 mL (1/2 - 1 cup or 4 - 8 ounces) of ORS for each diarrheal  stool or vomiting episode.   In a child with vomiting, it may be helpful to give the above ORS replacement in 5 mL (1 teaspoon) amounts every 5 minutes, then increase as tolerated.   While correcting for dehydration, children should eat normally. However, foods high in sugar should be avoided because this may worsen diarrhea. Large amounts of carbonated soft drinks, juice, gelatin desserts, and other highly sugared drinks should be avoided.   After correction of dehydration, other liquids that are appealing to the child may be added. Children should drink small amounts of fluids frequently and fluids should be increased as tolerated.   Adults should eat normally while drinking more fluids than usual. Drink small amounts of fluids frequently and increase as tolerated. Drink enough water and fluids to keep your urine clear or pale yellow. Broths, weak decaffeinated tea, lemon-lime soft drinks (allowed to go flat), and ORS replace fluids and electrolytes.   Avoid:   Carbonated drinks.   Juice.   Extremely hot or cold fluids.   Caffeine drinks.   Fatty, greasy foods.   Alcohol.   Tobacco.   Too much intake of anything at one time.   Gelatin desserts.   Probiotics are active cultures of beneficial bacteria. They may lessen the amount and number of diarrheal stools in adults. Probiotics can be found in yogurt with active cultures and in supplements.   Wash your hands well to avoid spreading bacteria and viruses.   Antidiarrheal medicines are not recommended for infants and children.   Only take over-the-counter or prescription medicines for   pain, discomfort, or fever as directed by your caregiver. Do not give aspirin to children.   For adults with dehydration, ask your caregiver if you should continue all prescribed and over-the-counter medicines.   If your caregiver has given you a follow-up appointment, it is very important to keep that appointment. Not keeping the appointment  could result in a lasting (chronic) or permanent injury and disability. If there is any problem keeping the appointment, you must call to reschedule.  SEEK IMMEDIATE MEDICAL CARE IF:   You are unable to keep fluids down.   There is no urine output in 6 to 8 hours or there is only a small amount of very dark urine.   You develop shortness of breath.   There is blood in the vomit (may look like coffee grounds) or stool.   Belly (abdominal) pain develops, increases, or localizes.   There is persistent vomiting or diarrhea.   You have a fever.   Your baby is older than 3 months with a rectal temperature of 102 F (38.9 C) or higher.   Your baby is 3 months old or younger with a rectal temperature of 100.4 F (38 C) or higher.  MAKE SURE YOU:   Understand these instructions.   Will watch your condition.   Will get help right away if you are not doing well or get worse.  Document Released: 11/30/2005 Document Revised: 08/12/2011 Document Reviewed: 04/13/2007 [employer] Patient Information 2012 [employer]. 
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
## 2013-05-12 ENCOUNTER — Encounter: Payer: Self-pay | Admitting: Medical

## 2013-05-12 ENCOUNTER — Ambulatory Visit (INDEPENDENT_AMBULATORY_CARE_PROVIDER_SITE_OTHER): Payer: 59 | Admitting: Medical

## 2013-05-12 ENCOUNTER — Ambulatory Visit: Payer: 59 | Admitting: Medical

## 2013-05-12 VITALS — BP 122/80 | HR 76 | Temp 98.8°F | Resp 16 | Wt 202.0 lb

## 2013-05-12 DIAGNOSIS — W57XXXA Bitten or stung by nonvenomous insect and other nonvenomous arthropods, initial encounter: Secondary | ICD-10-CM

## 2013-05-12 DIAGNOSIS — L255 Unspecified contact dermatitis due to plants, except food: Secondary | ICD-10-CM

## 2013-05-12 DIAGNOSIS — L237 Allergic contact dermatitis due to plants, except food: Secondary | ICD-10-CM

## 2013-05-12 MED ORDER — METHYLPREDNISOLONE 4 MG PO KIT: PACK | ORAL | Status: DC

## 2013-05-12 MED ORDER — TRIAMCINOLONE ACETONIDE 0.1 % EX CREA: TOPICAL_CREAM | Freq: Two times a day (BID) | CUTANEOUS | Status: DC

## 2013-05-12 MED ORDER — DOXYCYCLINE HYCLATE 100 MG PO TABS: 100.0000 mg | ORAL_TABLET | Freq: Two times a day (BID) | ORAL | Status: DC

## 2013-05-12 NOTE — Patient Instructions (Signed)
Tick bite - keep area clean and dry.   Take some OTC benadryl.   You can use the triamcinolone cream to this area.   If you start getting rash or worse symptoms suggestive of tick born illness (fever, headache, nausea, joint aches, etc.) then begin Doxycyline antibiotic and call us.  Poison Ivy - begin triamcinolone cream topically.  However, if you get a widespread rash that is itchy and seems like the poison ivy rash is worsening, then begin Medrol Dosepak.    I don't anticipate you having to use either the doxycyline or medrol, but you have the script printed just in case.  Deer Tick Bite Deer ticks are brown arachnids (spider family) that vary in size from as small as the head of a pin to 1/4 inch (1/2 cm) diameter. They thrive in wooded areas. Deer are the preferred host of adult deer ticks. Small rodents are the host of young ticks (nymphs). When a person walks in a field or wooded area, young and adult ticks in the surrounding grass and vegetation can attach themselves to the skin. They can suck blood for hours to days if unnoticed. Ticks are found all over the [place] Some ticks carry a specific bacteria (Borrelia burgdorferi) that causes an infection called Lyme disease. The bacteria is typically passed into a person during the blood sucking process. This happens after the tick has been attached for at least a number of hours. While ticks can be found all over the [place] those carrying the bacteria that causes Lyme disease are most common in [place] and the [place]. Only a small proportion of ticks in these areas carry the Lyme disease bacteria and cause human infections. Ticks usually attach to warm spots on the body, such as the:  Head.  Back.  Neck.  Armpits.  Groin. SYMPTOMS  Most of the time, a deer tick bite will not be felt. You may or may not see the attached tick. You may notice mild irritation or redness around the bite site. If the deer tick passes the Lyme disease bacteria  to a person, a round, red rash may be noticed 2 to 3 days after the bite. The rash may be clear in the middle, like a bull's-eye or target. If not treated, other symptoms may develop several days to weeks after the onset of the rash. These symptoms may include:  New rash lesions.  Fatigue and weakness.  General ill feeling and achiness.  Chills.  Headache and neck pain.  Swollen lymph glands.  Sore muscles and joints. 5 to 15% of untreated people with Lyme disease may develop more severe illnesses after several weeks to months. This may include inflammation of the brain lining (meningitis), nerve palsies, an abnormal heartbeat, or severe muscle and joint pain and inflammation (myositis or arthritis). DIAGNOSIS   Physical exam and medical history.  Viewing the tick if it was saved for confirmation.  Blood tests (to check or confirm the presence of Lyme disease). TREATMENT  Most ticks do not carry disease. If found, an attached tick should be removed using tweezers. Tweezers should be placed under the body of the tick so it is removed by its attachment parts (pincers). If there are signs or symptoms of being sick, or Lyme disease is confirmed, medicines (antibiotics) that kill germs are usually prescribed. In more severe cases, antibiotics may be given through an intravenous (IV) access. HOME CARE INSTRUCTIONS   Always remove ticks with tweezers. Do not use petroleum jelly  or other methods to kill or remove the tick. Slide the tweezers under the body and pull out as much as you can. If you are not sure what it is, save it in a jar and show your caregiver.  Once you remove the tick, the skin will heal on its own. Wash your hands and the affected area with water and soap. You may place a bandage on the affected area.  Take medicine as directed. You may be advised to take a full course of antibiotics.  Follow up with your caregiver as recommended. FINDING OUT THE RESULTS OF YOUR  TEST Not all test results are available during your visit. If your test results are not back during the visit, make an appointment with your caregiver to find out the results. Do not assume everything is normal if you have not heard from your caregiver or the medical facility. It is important for you to follow up on all of your test results. PROGNOSIS  If Lyme disease is confirmed, early treatment with antibiotics is very effective. Following preventive guidelines is important since it is possible to get the disease more than once. PREVENTION   Wear long sleeves and long pants in wooded or grassy areas. Tuck your pants into your socks.  Use an insect repellent while hiking.  Check yourself, your children, and your pets regularly for ticks after playing outside.  Clear piles of leaves or brush from your yard. Ticks might live there. SEEK MEDICAL CARE IF:   You or your child has an oral temperature above 102 F (38.9 C).  You develop a severe headache following the bite.  You feel generally ill.  You notice a rash.  You are having trouble removing the tick.  The bite area has red skin or yellow drainage. SEEK IMMEDIATE MEDICAL CARE IF:   Your face is weak and droopy or you have other neurological symptoms.  You have severe joint pain or weakness. MAKE SURE YOU:   Understand these instructions.  Will watch your condition.  Will get help right away if you are not doing well or get worse. FOR MORE INFORMATION Centers for Disease Control and Prevention: FootballExhibition.com.br American Academy of Family Physicians: www.https://powers.com/ Document Released: 02/24/2010 Document Revised: 02/22/2012 Document Reviewed: 02/24/2010 [facility] Patient Information 2014 [place], [place].

## 2013-05-12 NOTE — Progress Notes (Signed)
Subjective: Here for 2 skin issues.  He works at [employer], [employer], and has contact dermatitis by poison ivy on legs.  Has had this before.  Been trying topical hydrocortisone with not much relief.     Got tick bite on right lateral foot 2 days ago.  Pulled tick off, but wanted this checked out.  Was swollen and red yesterday, but now just has the small red bite area.  No fever, headaches, targetoid or diffuse rash, no nausea, no joint ache, no NVD.   Objective: Gen: wd, wn, nad Skin: right lateral dorsal foot with small 3mm diameter area of erythema.  No induration, no drainage, no warmth.  Few small patches of slightly raised erythema on bilat medial feet suggestive of contact dermatitis  Assessment: Encounter Diagnoses  Name Primary?  . Poison ivy dermatitis Yes  . Tick bite    Plan: poison ivy dermatitis - triamcinolone cream, avoidance of poison ivy, wash contaminated clothes, gloves, shoes in hot soapy water.  If worse rash, can use the Medrol dosepak.  Tick bite - soap and water to keep area clean, benadryl OTC.  Discussed signs/symptoms of tick borne illness which he doesn't current have.   Since this is a weekend, advised if worse symptoms suggestive of tick borne illness, then begin doxycyline.   Printed script given just in case.

## 2013-11-01 ENCOUNTER — Encounter: Payer: Self-pay | Admitting: Medical

## 2013-11-01 ENCOUNTER — Ambulatory Visit (INDEPENDENT_AMBULATORY_CARE_PROVIDER_SITE_OTHER): Payer: 59 | Admitting: Medical

## 2013-11-01 VITALS — BP 122/80 | HR 80 | Temp 98.4°F | Resp 16 | Wt 213.0 lb

## 2013-11-01 DIAGNOSIS — R3911 Hesitancy of micturition: Secondary | ICD-10-CM

## 2013-11-01 DIAGNOSIS — R300 Dysuria: Secondary | ICD-10-CM

## 2013-11-01 MED ORDER — TAMSULOSIN HCL 0.4 MG PO CAPS: 0.4000 mg | ORAL_CAPSULE | Freq: Every day | ORAL | Status: DC

## 2013-11-01 NOTE — Progress Notes (Signed)
Subjective: He reports 2 week history of decreased urinary stream, some urinary dribbling, hesitancy, mild lower abdominal discomfort.  He does have one new sexual partner in the last few months, wants to be screen for STD.  Denies any penile redness or discharge. Not getting up at night to urinate. He does have a history of prostatitis, no other current symptoms, no fever, no nausea vomiting or diarrhea, no back pain.  Objective: Abdomen: Mild suprapubic tenderness, otherwise nontender, no mass, no organomegaly GU: Normal external genitalia, circumcised, no mass or lymphadenopathy, no redness, no discharge Declines rectal exam today  Assessment: Encounter Diagnoses  Name Primary?  . Urinary hesitancy Yes  . Dysuria    Plan: Discussed possible causes including STD, urinary tract infection, prostatitis, BPH, retenetion, other.  Begin Flomax, hydrate well, we will call with results.  If worse in the next few days suggesting prostatitis, call back.

## 2013-11-02 LAB — GC/CHLAMYDIA PROBE AMP
CT Probe RNA: NEGATIVE
GC Probe RNA: NEGATIVE

## 2013-11-03 ENCOUNTER — Ambulatory Visit: Payer: 59 | Admitting: Medical

## 2013-11-20 ENCOUNTER — Encounter: Payer: Self-pay | Admitting: Medical

## 2013-11-20 ENCOUNTER — Ambulatory Visit (INDEPENDENT_AMBULATORY_CARE_PROVIDER_SITE_OTHER): Payer: 59 | Admitting: Medical

## 2013-11-20 VITALS — BP 100/70 | HR 80 | Temp 98.2°F | Resp 16 | Ht 72.0 in | Wt 207.0 lb

## 2013-11-20 DIAGNOSIS — Z0000 Encounter for general adult medical examination without abnormal findings: Secondary | ICD-10-CM

## 2013-11-20 DIAGNOSIS — R0683 Snoring: Secondary | ICD-10-CM

## 2013-11-20 DIAGNOSIS — R0681 Apnea, not elsewhere classified: Secondary | ICD-10-CM

## 2013-11-20 DIAGNOSIS — L290 Pruritus ani: Secondary | ICD-10-CM

## 2013-11-20 DIAGNOSIS — Z23 Encounter for immunization: Secondary | ICD-10-CM

## 2013-11-20 DIAGNOSIS — R0989 Other specified symptoms and signs involving the circulatory and respiratory systems: Secondary | ICD-10-CM

## 2013-11-20 DIAGNOSIS — Z113 Encounter for screening for infections with a predominantly sexual mode of transmission: Secondary | ICD-10-CM

## 2013-11-20 DIAGNOSIS — R400 Somnolence: Secondary | ICD-10-CM

## 2013-11-20 DIAGNOSIS — G4710 Hypersomnia, unspecified: Secondary | ICD-10-CM

## 2013-11-20 DIAGNOSIS — R3911 Hesitancy of micturition: Secondary | ICD-10-CM

## 2013-11-20 DIAGNOSIS — R0609 Other forms of dyspnea: Secondary | ICD-10-CM

## 2013-11-20 LAB — POCT URINALYSIS DIPSTICK
Bilirubin, UA: NEGATIVE
Blood, UA: NEGATIVE
Glucose, UA: NEGATIVE
Ketones, UA: NEGATIVE
Leukocytes, UA: NEGATIVE
Urobilinogen, UA: NEGATIVE
pH, UA: 6

## 2013-11-20 LAB — CBC WITH DIFFERENTIAL/PLATELET
Basophils Absolute: 0.1 10*3/uL (ref 0.0–0.1)
Basophils Relative: 1 % (ref 0–1)
HCT: 46.2 % (ref 39.0–52.0)
Lymphocytes Relative: 41 % (ref 12–46)
Lymphs Abs: 1.9 10*3/uL (ref 0.7–4.0)
Neutro Abs: 2 10*3/uL (ref 1.7–7.7)
Neutrophils Relative %: 43 % (ref 43–77)
Platelets: 270 10*3/uL (ref 150–400)
RBC: 5.72 MIL/uL (ref 4.22–5.81)
RDW: 12.9 % (ref 11.5–15.5)
WBC: 4.7 10*3/uL (ref 4.0–10.5)

## 2013-11-20 MED ORDER — HYDROCORTISONE 2.5 % EX CREA: TOPICAL_CREAM | Freq: Two times a day (BID) | CUTANEOUS | Status: DC

## 2013-11-20 NOTE — Progress Notes (Signed)
Subjective:   HPI  Gary Garrett is a 42 y.o. male who presents for a complete physical.   Preventative care: Last ophthalmology visit:n/a Last dental visit:yes Last colonoscopy:n/a Last prostate exam: 2014 Last EKG:04/2008 Last labs:2013  Prior vaccinations: TD or Tdap:02/2005 Influenza:11/20/13 Pneumococcal:n/a Shingles/Zostavax:n/a  Advanced directive:n/a Health care power of attorney:n/a Living will:n/a  Concerns: Sleep issues - he notes for months now at least his family and friends have witnessed him having apneic spells, he snores very loud, he doesn't sleep well, often awakes not feeling rested, sometimes with daytime sleepiness.  No prior sleep study.   Itching anus - has tried OTC fungal cream and desitin for several weeks without improvement.   Reviewed their medical, surgical, family, social, medication, and allergy history and updated chart as appropriate.  Past Medical History  Diagnosis Date  . Allergy   . GAD (generalized anxiety disorder)   . Asthma     allergen induced asthma  . 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 Tick bite of buttock 04/06/12    Past Surgical History  Procedure Laterality Date  . Wisdom tooth extraction      History   Social History  . Marital Status: Single    Spouse Name: N/A    Number of Children: N/A  . Years of Education: N/A   Occupational History  . landscape architecture    Social History Main Topics  . Smoking status: Former Smoker -- 0.25 packs/day    Quit date: 03/29/2011  . Smokeless tobacco: Never Used  . Alcohol Use: No  . Drug Use: No  . Sexual Activity: Not on file   Other Topics Concern  . Not on file   Social History Narrative   Lives at home by himself, single.  Exercise - stretching, walking, running, weights.  Mult partners, MSM, condom use.  Tax inspector, [employer].    Family History  Problem Relation Age of Onset  . Hypertension Paternal Grandmother    . Heart disease Paternal Grandfather     died in 57s of heart disease  . Diabetes Neg Hx   . Cancer Other     great grandmother lung cancer  . Asthma Sister   . Stroke Maternal Grandmother     Current outpatient prescriptions:Ascorbic Acid (VITAMIN C) 1000 MG tablet, Take 1,000 mg by mouth daily.  , Disp: , Rfl: ;  cetirizine (ZYRTEC) 10 MG tablet, Take 10 mg by mouth daily., Disp: , Rfl: ;  Multiple Vitamin (MULTIVITAMIN) tablet, Take 1 tablet by mouth daily.  , Disp: , Rfl: ;  tamsulosin (FLOMAX) 0.4 MG CAPS capsule, Take 1 capsule (0.4 mg total) by mouth daily., Disp: 30 capsule, Rfl: 0  No Known Allergies   Review of Systems Constitutional: -fever, -chills, -sweats, -unexpected weight change, -decreased appetite, -fatigue Allergy: -sneezing, -itching, -congestion Dermatology: -changing moles, --rash, -lumps ENT: -runny nose, -ear pain, -sore throat, -hoarseness, -sinus pain, -teeth pain, - ringing in ears, -hearing loss, -nosebleeds Cardiology: -chest pain, -palpitations, -swelling, -difficulty breathing when lying flat, -waking up short of breath Respiratory: -cough, -shortness of breath, -difficulty breathing with exercise or exertion, -wheezing, -coughing up blood Gastroenterology: -abdominal pain, -nausea, -vomiting, -diarrhea, -constipation, -blood in stool, -changes in bowel movement, -difficulty swallowing or eating Hematology: -bleeding, -bruising  Musculoskeletal: -joint aches, -muscle aches, -joint swelling, -back pain, -neck pain, -cramping, -changes in gait Ophthalmology: denies vision changes, eye redness, itching, discharge Urology: -burning with urination, -difficulty urinating, -blood in  urine, -urinary frequency, -urgency, -incontinence Neurology: -headache, -weakness, -tingling, -numbness, -memory loss, -falls, -dizziness Psychology: -depressed mood, -agitation, +sleep problems     Objective:   Physical Exam  BP 100/70  Pulse 80  Temp(Src) 98.2 F (36.8 C)  (Oral)  Resp 16  Ht 6' (1.829 m)  Wt 207 lb (93.895 kg)  BMI 28.07 kg/m2  General appearance: alert, no distress, WD/WN, white male  Skin: upper arms bilaterally with patches of brown/pink flat macular lesions that he notes is scar from prior poison ivy dermatitis, scattered benign appearing macules on back and arms HEENT: normocephalic, conjunctiva/corneas normal, sclerae anicteric, PERRLA, EOMi, nares patent, no discharge or erythema, pharynx normal Oral cavity: MMM, tongue normal, teeth in good repair Neck: supple, no lymphadenopathy, no thyromegaly, no masses, normal ROM, no bruits, 17" circumference Chest: non tender, normal shape and expansion Heart: RRR, normal S1, S2, no murmurs Lungs: CTA bilaterally, no wheezes, rhonchi, or rales Abdomen: +bs, soft, non tender, non distended, no masses, no hepatomegaly, no splenomegaly, no bruits Back: non tender, normal ROM, no scoliosis Musculoskeletal: upper extremities non tender, no obvious deformity, normal ROM throughout, lower extremities non tender, no obvious deformity, normal ROM throughout Extremities: no edema, no cyanosis, no clubbing Pulses: 2+ symmetric, upper and lower extremities, normal cap refill Neurological: alert, oriented x 3, CN2-12 intact, strength normal upper extremities and lower extremities, sensation normal throughout, DTRs 2+ throughout, no cerebellar signs, gait normal Psychiatric: normal affect, behavior normal, pleasant  GU: normal male external genitalia, circumcised, nontender, no masses, no hernia, no lymphadenopathy Rectal: anus normal appearing, normal tone, no prostate nodules, but prostate normal to slightly enlarged.   Assessment and Plan :    Encounter Diagnoses  Name Primary?  . Routine general medical examination at a health care facility Yes  . Need for prophylactic vaccination and inoculation against influenza   . Snoring   . Witnessed apneic spells   . Somnolence, daytime   . Screen for STD  (sexually transmitted disease)   . Urinary hesitancy   . Anal itching     Physical exam - discussed healthy lifestyle, diet, exercise, preventative care, vaccinations, and addressed their concerns.   Counseled on the influenza virus vaccine.  Vaccine information sheet given.  Influenza vaccine given after consent obtained. Snoring, witnessed apnea, daytime somnolence- he has neck circumference of 17", small airway.  Epworth sleep scale of 9.  Will refer for sleep study STD screening today, discussed safe sex Urinary hesitancy - improved with Flomax.   He has stopped this. Prostate feels maybe slightly enlarged.  PSA test today Anal itching - no obvious abnormality on exam.  Failed 3 wk of Lotrimin cream and desitin.  Trial of hydrocortisone cream today, script given. Follow-up pending labs

## 2013-11-20 NOTE — Patient Instructions (Addendum)
Encounter Diagnoses  Name Primary?  . Routine general medical examination at a health care facility Yes  . Need for prophylactic vaccination and inoculation against influenza   . Snoring   . Witnessed apneic spells   . Somnolence, daytime   . Screen for STD (sexually transmitted disease)   . Urinary hesitancy    Check these diagnosis codes with your insurance company for coverage of sleep study. [phone]

## 2013-11-21 ENCOUNTER — Encounter: Payer: Self-pay | Admitting: Medical

## 2013-11-21 LAB — LIPID PANEL
Cholesterol: 199 mg/dL (ref 0–200)
HDL: 33 mg/dL — ABNORMAL LOW (ref >39)
Triglycerides: 146 mg/dL (ref <150)
VLDL: 29 mg/dL (ref 0–40)

## 2013-11-21 LAB — COMPREHENSIVE METABOLIC PANEL
ALT: 44 U/L (ref 0–53)
Albumin: 4.7 g/dL (ref 3.5–5.2)
BUN: 13 mg/dL (ref 6–23)
CO2: 24 mEq/L (ref 19–32)
Calcium: 9.6 mg/dL (ref 8.4–10.5)
Chloride: 103 mEq/L (ref 96–112)
Creat: 0.87 mg/dL (ref 0.50–1.35)
Potassium: 4.2 mEq/L (ref 3.5–5.3)
Sodium: 137 mEq/L (ref 135–145)
Total Protein: 7.4 g/dL (ref 6.0–8.3)

## 2013-11-21 LAB — PSA: PSA: 0.57 ng/mL (ref <=4.00)

## 2013-11-22 ENCOUNTER — Encounter: Payer: Self-pay | Admitting: Medical

## 2013-11-24 ENCOUNTER — Other Ambulatory Visit: Payer: Self-pay | Admitting: Family Medicine

## 2013-11-24 DIAGNOSIS — R0683 Snoring: Secondary | ICD-10-CM

## 2013-12-01 ENCOUNTER — Encounter: Payer: Self-pay | Admitting: Medical

## 2013-12-01 ENCOUNTER — Telehealth: Payer: Self-pay | Admitting: Family Medicine

## 2013-12-01 NOTE — Telephone Encounter (Signed)
Please set up in home sleep study through [facility]

## 2013-12-01 NOTE — Telephone Encounter (Signed)
Gary Garrett with Sleep Study called and states insurance declined the in lab sleep study due to lack of evidence and can do in house sleep study only.

## 2013-12-04 ENCOUNTER — Ambulatory Visit (INDEPENDENT_AMBULATORY_CARE_PROVIDER_SITE_OTHER): Payer: 59 | Admitting: Medical

## 2013-12-04 ENCOUNTER — Encounter: Payer: Self-pay | Admitting: Medical

## 2013-12-04 VITALS — BP 120/88 | HR 72 | Temp 98.3°F | Resp 16 | Wt 213.0 lb

## 2013-12-04 DIAGNOSIS — H1010 Acute atopic conjunctivitis, unspecified eye: Secondary | ICD-10-CM

## 2013-12-04 DIAGNOSIS — T783XXA Angioneurotic edema, initial encounter: Secondary | ICD-10-CM

## 2013-12-04 DIAGNOSIS — H1045 Other chronic allergic conjunctivitis: Secondary | ICD-10-CM

## 2013-12-04 NOTE — Telephone Encounter (Signed)
Faxing over patient information to [facility]. CLS

## 2013-12-04 NOTE — Progress Notes (Signed)
Subjective: Here for eye swelling.  He notes a few times in the past month has had alternating swelling of the medial side of upper eyelid without redness, drainage, nor pus.  He gets some irritation or swelling, but no other symptoms.  No other aggravating or relieving factors.   No other c/o.    Objective: Gen: wd, wn,nad Eyes: conjunctiva injected bilat, otherwise no swelling, no erythema, no pus, no drainage, PERRLA, EOMi HENT otherwise normal  Assessment: Encounter Diagnoses  Name Primary?  . Allergic angioedema, initial encounter Yes  . Allergic conjunctivitis, unspecified laterality    Plan: After reviewing symptoms, eye exam and his photos of unilateral blepharal swelling of each eye at various times, an allergen trigger seems likely.  Begin OTC allergy eye drops, c/t zyrtec, but take daily.   Recheck in a few weeks if desired for allergy testing, but must have stopped anthistamines 1 wk prior to labs.

## 2013-12-05 ENCOUNTER — Ambulatory Visit: Payer: 59 | Admitting: Medical

## 2013-12-25 ENCOUNTER — Ambulatory Visit (HOSPITAL_BASED_OUTPATIENT_CLINIC_OR_DEPARTMENT_OTHER): Payer: 59 | Admitting: Radiology

## 2013-12-25 VITALS — Ht 72.0 in | Wt 205.0 lb

## 2013-12-25 NOTE — Sleep Study (Unsigned)
THE PATIENT ARRIVED TO SLEEP DISORDERS CENTER IN NO NOTED DISTRESS.THE PATIENT IS SCHEDULED TO UNDERGO AN OUT OF CENTER SLEEP TEST. THE TECH DEMONSTRATED THE APPLICATION ON THE HST.THE PATIENT PRESENTED CLEAR UNDERSTANDING OF THE APPLICATION DURING THE TEACH BACK. THE PATIENT WAS INSTRUCTED TO RETURN THE DEVICE ON THE NEXT BUSINESS DAY.THE PATIENT AGREED.

## 2013-12-29 ENCOUNTER — Encounter (HOSPITAL_BASED_OUTPATIENT_CLINIC_OR_DEPARTMENT_OTHER): Payer: 59

## 2014-01-01 ENCOUNTER — Ambulatory Visit (HOSPITAL_BASED_OUTPATIENT_CLINIC_OR_DEPARTMENT_OTHER): Payer: 59 | Attending: Medical | Admitting: Radiology

## 2014-01-01 DIAGNOSIS — G4733 Obstructive sleep apnea (adult) (pediatric): Secondary | ICD-10-CM | POA: Insufficient documentation

## 2014-01-01 DIAGNOSIS — R0989 Other specified symptoms and signs involving the circulatory and respiratory systems: Secondary | ICD-10-CM | POA: Insufficient documentation

## 2014-01-01 DIAGNOSIS — R0609 Other forms of dyspnea: Secondary | ICD-10-CM | POA: Insufficient documentation

## 2014-01-01 DIAGNOSIS — R0683 Snoring: Secondary | ICD-10-CM

## 2014-01-03 ENCOUNTER — Encounter: Payer: Self-pay | Admitting: Medical

## 2014-01-13 DIAGNOSIS — R0609 Other forms of dyspnea: Secondary | ICD-10-CM

## 2014-01-13 DIAGNOSIS — G4733 Obstructive sleep apnea (adult) (pediatric): Secondary | ICD-10-CM

## 2014-01-13 DIAGNOSIS — R0989 Other specified symptoms and signs involving the circulatory and respiratory systems: Secondary | ICD-10-CM

## 2014-01-13 NOTE — Sleep Study (Signed)
    NAME: Gary Garrett DATE OF BIRTH:  Nov 25, 1971 MEDICAL RECORD NUMBER [id number]  LOCATION: [facility]  PHYSICIAN: Aracelli Woloszyn D  DATE OF STUDY: 01/01/2014  SLEEP STUDY TYPE: Out of Center Sleep Test-unattended              REFERRING PHYSICIAN: Carlena Hurl, PA-C  INDICATION FOR STUDY: Hypersomnia with sleep apnea  EPWORTH SLEEPINESS SCORE:   11/24 HEIGHT:   6 feet WEIGHT:   205 pounds  NECK SIZE:   17 inches in.  MEDICATIONS: Charted for review  IMPRESSION:   1) Severe obstructive sleep apnea/hypopnea syndrome, AHI 54.9 per hour. A total of 5 central apneas, 170 obstructive apneas, 32 mixed apneas and 149 hypopneas recorded. Events were more common while supine and prone. 2) Snoring with oxygen desaturation to a nadir of 77%. 3) Average heart rate 72.6 per minute    RECOMMENDATION:   Scores in this range are usually addressed first with CPAP. Other interventions may be appropriate on an individual basis. A dedicated CPAP titration study can be arranged through the sleep disorder Center if appropriate.   Signed Baird Lyons M.D. Deneise Lever Diplomate, American Board of Sleep Medicine  ELECTRONICALLY SIGNED ON:  01/13/2014, 8:53 AM [facility] PH: [phone]   FX: [phone] [place]

## 2014-01-16 ENCOUNTER — Encounter: Payer: Self-pay | Admitting: Medical

## 2014-01-16 ENCOUNTER — Ambulatory Visit (INDEPENDENT_AMBULATORY_CARE_PROVIDER_SITE_OTHER): Payer: 59 | Admitting: Medical

## 2014-01-16 VITALS — BP 110/80 | HR 80 | Temp 98.4°F | Resp 16 | Wt 212.0 lb

## 2014-01-16 DIAGNOSIS — B354 Tinea corporis: Secondary | ICD-10-CM

## 2014-01-16 DIAGNOSIS — G4733 Obstructive sleep apnea (adult) (pediatric): Secondary | ICD-10-CM

## 2014-01-16 DIAGNOSIS — S058X9A Other injuries of unspecified eye and orbit, initial encounter: Secondary | ICD-10-CM

## 2014-01-16 DIAGNOSIS — S0590XA Unspecified injury of unspecified eye and orbit, initial encounter: Secondary | ICD-10-CM

## 2014-01-16 NOTE — Progress Notes (Signed)
                                                             Subjective:  Gary Garrett is a 43 y.o. male who presents for recheck on sleep study.  Insurance would not approve the sleep lab study so he had the overnight study. The equipment malfunctioned the first night, he had difficulty with equipment the second night too, but reportedly the recording was acceptable per sleep lab.  His symptoms do include witnessed apnea, fatigue, daytime sleepiness, non-restful sleep.  He has a week or more history of rash on left forearm, using over-the-counter hydrocortisone with some relief.  This afternoon while at work he was shuffling some papers, one piece of paper scuffed his right eye, and is a little irritated now.  No pain, no visual changes.  Wants this looked at.   No other c/o.  The following portions of the patient's history were reviewed and updated as appropriate: allergies, current medications, past family history, past medical history, past social history, past surgical history and problem list.  ROS Otherwise as in subjective above  Objective: Physical Exam  Vital signs reviewed  General appearance: alert, no distress, WD/WN Skin: Left forearm with 1.5 cm round erythematous lesion with raised border, slightly clearing center, suggestive of tinea Eyes: No obvious deformity of either eye, PERRLA, EOMI, conjunctiva normal appearing, fluorescein stain reveals no abrasion   Assessment: Encounter Diagnoses  Name Primary?  . OSA (obstructive sleep apnea) Yes  . Superficial eye injury   . Tinea corporis      Plan: OSA - reviewed the abnormal sleep study showing severe sleep apnea with pulse ox down to 77%, >50 AHI.  Of note, he did have several difficulties with the machine and malfunctioning equipment for the in home study.  Referral to sleep lab for titration.  Pending titration, advised that we would have home health bring out CPAP supplies, planned to get CPAP compliance report in  one month recheck in one month.  Superficial eye injury - fluorescein stain without obvious abrasion, no other obvious injury or trauma.  Advised to use over-the-counter Thera tears, can use some over-the-counter ibuprofen, go home and rest the eye, return if not improving  Tinea corporis - isolated patch on the form, begin over-the-counter Lamisil cream, follow up if not improving in next few weeks  Follow up: pending referral

## 2014-01-24 ENCOUNTER — Encounter: Payer: Self-pay | Admitting: Medical

## 2014-02-11 DIAGNOSIS — G4730 Sleep apnea, unspecified: Secondary | ICD-10-CM

## 2014-02-11 HISTORY — DX: Sleep apnea, unspecified: G47.30

## 2014-02-15 ENCOUNTER — Encounter: Payer: Self-pay | Admitting: Medical

## 2014-02-21 ENCOUNTER — Encounter: Payer: Self-pay | Admitting: Medical

## 2014-03-01 ENCOUNTER — Encounter: Payer: Self-pay | Admitting: Medical

## 2014-03-05 ENCOUNTER — Ambulatory Visit (INDEPENDENT_AMBULATORY_CARE_PROVIDER_SITE_OTHER): Payer: 59 | Admitting: Medical

## 2014-03-05 ENCOUNTER — Encounter: Payer: Self-pay | Admitting: Medical

## 2014-03-05 VITALS — BP 102/80 | HR 78 | Temp 98.2°F | Resp 14 | Wt 195.0 lb

## 2014-03-05 DIAGNOSIS — J309 Allergic rhinitis, unspecified: Secondary | ICD-10-CM

## 2014-03-05 DIAGNOSIS — G4733 Obstructive sleep apnea (adult) (pediatric): Secondary | ICD-10-CM

## 2014-03-05 DIAGNOSIS — R21 Rash and other nonspecific skin eruption: Secondary | ICD-10-CM

## 2014-03-05 MED ORDER — BECLOMETHASONE DIPROPIONATE 80 MCG/ACT NA AERS: 1.0000 | INHALATION_SPRAY | Freq: Every day | NASAL | Status: DC

## 2014-03-05 MED ORDER — MUPIROCIN 2 % EX OINT: 1.0000 "application " | TOPICAL_OINTMENT | Freq: Three times a day (TID) | CUTANEOUS | Status: DC

## 2014-03-05 NOTE — Progress Notes (Signed)
   Subjective:   Gary Garrett is a 43 y.o. male presenting on 03/05/2014 with Follow-up  OSA - Having trouble with the equipment for sleep apnea.  Had gotten up to 212 lb.  Even nasal cannula doesn't seem to be tolerable.   Has been back and forth to home health to try different masks. Has felt like he is sleeping better in general.  Less snoring too.   No other aggravating or relieving factors.  No other complaint.  Allergies - post nasal drainage, hoarse, but not a lot of other allergy symptoms currently.  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     Review of Systems ROS as in subjective      Objective:    Filed Vitals:   03/05/14 1053  BP: 102/80  Pulse: 78  Temp: 98.2 F (36.8 C)  Resp: 14    General appearance: alert, no distress, WD/WN HEENT: normocephalic, sclerae anicteric, TMs pearly, nares patent, no discharge or erythema, pharynx normal Oral cavity: MMM, no lesions Neck: supple, no lymphadenopathy, no thyromegaly, no masses, 16.5" diameter today Heart: RRR, normal S1, S2, no murmurs Lungs: CTA bilaterally, no wheezes, rhonchi, or rales Pulses: 2+ symmetric, upper and lower extremities, normal cap refill GU: small erythematous papule at base of penis dorsally without vesicles or surrounding erythema     Assessment: Encounter Diagnoses  Name Primary?  . OSA (obstructive sleep apnea) Yes  . Allergic rhinitis   . Rash and nonspecific skin eruption      Plan: OSA - d/c CPAP due to intolerance to several masks and strategies.  C/t his improvements with weight loss, plan to recheck sleep study in 2-3 mo  Allergic rhinitis - begin Qnasal, can c/t OTC QHS antihistamine  Rash - mupirocin, call if not resolving.  [name] was seen today for follow-up.  Diagnoses and associated orders for this visit:  OSA (obstructive sleep apnea)  Allergic rhinitis  Other Orders - mupirocin ointment (BACTROBAN) 2 %; Place 1 application into the nose 3  (three) times daily. - Beclomethasone Dipropionate (QNASL) 80 MCG/ACT AERS; Place 1 spray into the nose daily.    Return 2-3 mo.

## 2014-04-04 ENCOUNTER — Ambulatory Visit (INDEPENDENT_AMBULATORY_CARE_PROVIDER_SITE_OTHER): Payer: 59 | Admitting: Medical

## 2014-04-04 ENCOUNTER — Encounter: Payer: Self-pay | Admitting: Medical

## 2014-04-04 VITALS — BP 102/70 | HR 78 | Temp 98.2°F | Resp 16 | Wt 182.0 lb

## 2014-04-04 DIAGNOSIS — R21 Rash and other nonspecific skin eruption: Secondary | ICD-10-CM

## 2014-04-04 DIAGNOSIS — Z202 Contact with and (suspected) exposure to infections with a predominantly sexual mode of transmission: Secondary | ICD-10-CM

## 2014-04-04 DIAGNOSIS — G4733 Obstructive sleep apnea (adult) (pediatric): Secondary | ICD-10-CM

## 2014-04-04 DIAGNOSIS — E663 Overweight: Secondary | ICD-10-CM

## 2014-04-04 NOTE — Progress Notes (Signed)
   Subjective:   Gary Garrett is a 43 y.o. male presenting on 04/04/2014 with Follow-up and POSSIBLE STD CHECK  Since diagnosis of severe sleep apnea, has made significant efforts to lose weight.   Has cut out bread, drinking more water, going to gym 3 nights per week.  Lately doesn't feel as sluggish in afternoons, for the most part awakes feeling rested, sleeping better.   Snoring is less per others.  No recent choking episodes in his sleep.  Uses benadryl at bed time as a sleep aid. No other aggravating or relieving factors.    Grandfather 37yo passed unexpected with AAA the day of his last visit.  Concerns for STD.  Rash at base of penis.  New lesion.    No other complaint.  Review of Systems ROS as in subjective      Objective:     Filed Vitals:   04/04/14 0918  BP: 102/70  Pulse: 78  Temp: 98.2 F (36.8 C)  Resp: 16   Wt Readings from Last 3 Encounters:  04/04/14 182 lb (82.555 kg)  03/05/14 195 lb (88.451 kg)  01/16/14 212 lb (96.163 kg)    General appearance: alert, no distress, WD/WN Heart: RRR, normal S1, S2, no murmurs Lungs: CTA bilaterally, no wheezes, rhonchi, or rales Abdomen: +bs, soft, non tender, non distended, no masses, no hepatomegaly, no splenomegaly GU: 2 small papule vs veiscular lesions on pubic area just superior to base of penis      Assessment: Encounter Diagnoses  Name Primary?  Marland Kitchen Overweight Yes  . OSA (obstructive sleep apnea)   . Rash and nonspecific skin eruption   . Venereal disease contact      Plan: He will let me know when he wants to repeat home sleep study.  He has made significant improvement in weight loss.  We will plan f/u sleep study.  Pubic lesion unroofed and viral culture sent.  F/u pending labs  Gary Garrett was seen today for follow-up and possible std check.  Diagnoses and associated orders for this visit:  Overweight  OSA (obstructive sleep apnea)  Rash and nonspecific skin eruption - Viral  culture  Venereal disease contact - GC/Chlamydia Probe Amp - Viral culture     Return pending labs.

## 2014-04-05 LAB — GC/CHLAMYDIA PROBE AMP
CT Probe RNA: NEGATIVE
GC Probe RNA: NEGATIVE

## 2014-04-09 LAB — VIRAL CULTURE VIRC: Organism ID, Bacteria: NEGATIVE

## 2014-04-09 NOTE — Progress Notes (Signed)
LABS STILL PENDING AS OF 04/09/14

## 2014-04-11 ENCOUNTER — Encounter: Payer: Self-pay | Admitting: Medical

## 2014-04-11 ENCOUNTER — Ambulatory Visit (INDEPENDENT_AMBULATORY_CARE_PROVIDER_SITE_OTHER): Payer: 59 | Admitting: Medical

## 2014-04-11 VITALS — BP 102/60 | HR 78 | Temp 98.1°F | Resp 14 | Wt 178.0 lb

## 2014-04-11 DIAGNOSIS — A630 Anogenital (venereal) warts: Secondary | ICD-10-CM

## 2014-04-11 NOTE — Progress Notes (Signed)
   Subjective:   NIKAN ELLINGSON is a 43 y.o. male presenting on 04/11/2014 with cauterize some lesions here for recheck on bumps at base of penis.  Last visit we checked for herpes which was negative.  Bumps unchanged.  He apparently has hx/o anal warts, removed by general surgery prior.  No other complaint.  Review of Systems ROS as in subjective      Objective:    General appearance: alert, no distress, WD/WN GU: 2 small papule  lesions on pubic area just superior to base of penis      Assessment: Encounter Diagnoses  Name Primary?  . Genital warts Yes  . Anal wart      Plan: We discussed his concerns, discussed means of prevention discussed means of transmission . Advised he continue surveillance of any new lesions and let us know. Discussed risk and benefits of procedure, consents to procedure, prepped area in usual sterile fashion, used local anesthesia with 1.5 cc of lidocaine with epinephrine in the pubic area, used electrocautery to remove 2 small papular verrucal lesions at base of penis  Trystan was seen today for cauterize some lesions.  Diagnoses and associated orders for this visit:  Genital warts  Anal wart     Return if symptoms worsen or fail to improve.

## 2014-04-16 ENCOUNTER — Ambulatory Visit (INDEPENDENT_AMBULATORY_CARE_PROVIDER_SITE_OTHER): Payer: 59 | Admitting: Surgery

## 2014-04-16 ENCOUNTER — Encounter (INDEPENDENT_AMBULATORY_CARE_PROVIDER_SITE_OTHER): Payer: Self-pay | Admitting: Surgery

## 2014-04-16 VITALS — BP 118/70 | HR 80 | Temp 97.2°F | Resp 14 | Ht 72.0 in | Wt 176.6 lb

## 2014-04-16 DIAGNOSIS — K602 Anal fissure, unspecified: Secondary | ICD-10-CM | POA: Insufficient documentation

## 2014-04-16 DIAGNOSIS — L290 Pruritus ani: Secondary | ICD-10-CM

## 2014-04-16 NOTE — Progress Notes (Signed)
Status post anal fissure 2 years ago associated with some condyloma. The patient had 2 warts removed from the base of his penis last week. This area seems to be healing well. He wants to have a close check of his scrotum and his perianal region. He has not noticed any masses or pain in this area. No bleeding with bowel movements. He has noticed a couple bumps on the skin of the scrotum.  On examination, there is no sign of condyloma around his perianal region. The fissure is completely healed. No tenderness. The patient has some prominent follicles on his scrotum but no sign of any condyloma.  The patient's loss a considerable amount of weight in the last couple months with vigorous exercise and changed his diet. He should continue with his current bowel regimen. Follow up with [place] as needed if any new lesions come up around his perianal region.  Imogene Burn. Georgette Dover, MD, [facility]  General/ Trauma Surgery  04/16/2014 10:25 AM

## 2014-06-05 ENCOUNTER — Other Ambulatory Visit: Payer: Self-pay | Admitting: Family Medicine

## 2014-06-05 ENCOUNTER — Encounter: Payer: Self-pay | Admitting: Medical

## 2014-06-05 DIAGNOSIS — G4733 Obstructive sleep apnea (adult) (pediatric): Secondary | ICD-10-CM

## 2014-07-05 ENCOUNTER — Encounter: Payer: Self-pay | Admitting: Medical

## 2014-07-10 ENCOUNTER — Encounter (HOSPITAL_BASED_OUTPATIENT_CLINIC_OR_DEPARTMENT_OTHER): Payer: 59

## 2014-07-13 ENCOUNTER — Encounter: Payer: Self-pay | Admitting: Medical

## 2014-07-13 ENCOUNTER — Ambulatory Visit (INDEPENDENT_AMBULATORY_CARE_PROVIDER_SITE_OTHER): Payer: 59 | Admitting: Medical

## 2014-07-13 VITALS — BP 100/70 | HR 68 | Temp 98.0°F | Resp 16 | Wt 174.0 lb

## 2014-07-13 DIAGNOSIS — G4733 Obstructive sleep apnea (adult) (pediatric): Secondary | ICD-10-CM

## 2014-07-13 DIAGNOSIS — Q386 Other congenital malformations of mouth: Secondary | ICD-10-CM

## 2014-07-13 DIAGNOSIS — L723 Sebaceous cyst: Secondary | ICD-10-CM

## 2014-07-13 NOTE — Progress Notes (Signed)
Subjective: Here for several questions.  We diagnosed him back in January with the obstructive sleep apnea, he did not tolerate the CPAP at all despite several masks.  He ended up losing weight and is now down to normal BMI. He feels fine, sleeping fine, waking rested, no witnessed apnea, basically doing fine. He would like a repeat sleep study to confirm that everything is okay but insurance is rejecting the repeat sleep study.   He has a nodule on his right shoulder he wants me to look at. Not sure how long it has been there  He has some spots in his mouth on his lips he wants me to look at. Has a history of cold sores but this is different. Using Chapstick   Review of systems as in subjective  Objective: General: Well-developed, well-nourished, acute stress Right posterior shoulder with small well-defined 1 cm raised nodule mobile suggestive of sebaceous cyst Inside bilateral upper lips with small pearly 1 mm papules suggestive of Fordyce spots  Assessment: Encounter Diagnoses  Name Primary?  . OSA (obstructive sleep apnea) Yes  . Sebaceous cyst   . Fordyce spots     Plan: OSA-at this point I suspect his sleep apnea has resolved given his weight loss and lack of symptoms now. He will potentially talk with insurer about getting a confirmation sleep study based on what has been done so far. He mainly wants to confirm that the sleep apnea obstruction has completely gone away. I mainly advise he continue maintain normal height weight, eat healthy, getting good sleep pattern, exercise regularly  Sebaceous cyst - discussed the findings, discussed possible complications. At this point we will leave it alone since it is not bothering him  Fordyce spots-discussed the lesions, his current use of lip balm, hydration, and he will let us know if the lesions change, discussed using watch and wait approach as these may go away on their own.  I talked with him about his mother's new diagnosis of  breast cancer.     F/u prn.

## 2014-12-03 ENCOUNTER — Encounter: Payer: Self-pay | Admitting: Medical

## 2014-12-03 ENCOUNTER — Ambulatory Visit (INDEPENDENT_AMBULATORY_CARE_PROVIDER_SITE_OTHER): Payer: 59 | Admitting: Medical

## 2014-12-03 VITALS — BP 100/70 | HR 85 | Temp 98.4°F | Resp 16 | Ht 72.0 in | Wt 173.0 lb

## 2014-12-03 DIAGNOSIS — Z8669 Personal history of other diseases of the nervous system and sense organs: Secondary | ICD-10-CM

## 2014-12-03 DIAGNOSIS — Z0000 Encounter for general adult medical examination without abnormal findings: Secondary | ICD-10-CM

## 2014-12-03 DIAGNOSIS — Z113 Encounter for screening for infections with a predominantly sexual mode of transmission: Secondary | ICD-10-CM

## 2014-12-03 DIAGNOSIS — Z23 Encounter for immunization: Secondary | ICD-10-CM

## 2014-12-03 DIAGNOSIS — G250 Essential tremor: Secondary | ICD-10-CM

## 2014-12-03 DIAGNOSIS — R251 Tremor, unspecified: Secondary | ICD-10-CM

## 2014-12-03 DIAGNOSIS — Z87898 Personal history of other specified conditions: Secondary | ICD-10-CM

## 2014-12-03 LAB — COMPREHENSIVE METABOLIC PANEL
ALT: 20 U/L (ref 0–53)
AST: 12 U/L (ref 0–37)
Albumin: 4.5 g/dL (ref 3.5–5.2)
Alkaline Phosphatase: 47 U/L (ref 39–117)
BILIRUBIN TOTAL: 0.4 mg/dL (ref 0.2–1.2)
BUN: 25 mg/dL — AB (ref 6–23)
CO2: 26 mEq/L (ref 19–32)
Calcium: 9.1 mg/dL (ref 8.4–10.5)
Chloride: 108 mEq/L (ref 96–112)
Creat: 0.86 mg/dL (ref 0.50–1.35)
GLUCOSE: 97 mg/dL (ref 70–99)
Potassium: 4.1 mEq/L (ref 3.5–5.3)
SODIUM: 136 meq/L (ref 135–145)
Total Protein: 6.8 g/dL (ref 6.0–8.3)

## 2014-12-03 LAB — POCT URINALYSIS DIPSTICK
Bilirubin, UA: NEGATIVE
Blood, UA: NEGATIVE
Glucose, UA: NEGATIVE
Ketones, UA: NEGATIVE
Leukocytes, UA: NEGATIVE
Nitrite, UA: NEGATIVE
PH UA: 6
Protein, UA: NEGATIVE
SPEC GRAV UA: 1.025
UROBILINOGEN UA: NEGATIVE

## 2014-12-03 LAB — CBC
HCT: 43 % (ref 39.0–52.0)
Hemoglobin: 15.1 g/dL (ref 13.0–17.0)
MCH: 29.5 pg (ref 26.0–34.0)
MCHC: 35.1 g/dL (ref 30.0–36.0)
MCV: 84.1 fL (ref 78.0–100.0)
MPV: 8.9 fL — AB (ref 9.4–12.4)
Platelets: 243 10*3/uL (ref 150–400)
RBC: 5.11 MIL/uL (ref 4.22–5.81)
RDW: 12.8 % (ref 11.5–15.5)
WBC: 3.9 10*3/uL — ABNORMAL LOW (ref 4.0–10.5)

## 2014-12-03 LAB — LIPID PANEL
Cholesterol: 144 mg/dL (ref 0–200)
HDL: 35 mg/dL — AB (ref >39)
LDL Cholesterol: 98 mg/dL (ref 0–99)
TRIGLYCERIDES: 54 mg/dL (ref <150)
Total CHOL/HDL Ratio: 4.1 Ratio
VLDL: 11 mg/dL (ref 0–40)

## 2014-12-03 NOTE — Progress Notes (Signed)
Subjective:   HPI  Gary Garrett is a 43 y.o. male who presents for a complete physical.   Preventative care: Last ophthalmology visit:n/a Last dental visit:yes Last colonoscopy:n/a Last prostate exam: ? Last EKG:n/a Last labs:2014  Prior vaccinations: TD or Tdap:12/03/14 Influenza:12/03/14 Pneumococcal:n/a Shingles/Zostavax:n/a  Concerns: Hx/o asthma - no recent problems or concerns  He notes occasional tremor of hands, bilat.   Drinks one cup of black coffee daily, not skipping meals.   No tremor with rest.  Only notices when holding something or picking something up.  Reviewed their medical, surgical, family, social, medication, and allergy history and updated chart as appropriate.  Past Medical History  Diagnosis Date  . Allergy   . GAD (generalized anxiety disorder)   . Asthma     allergen induced asthma  . 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 Tick bite of buttock 04/06/12  . Sleep apnea 3/15    Past Surgical History  Procedure Laterality Date  . Wisdom tooth extraction      History   Social History  . Marital Status: Single    Spouse Name: N/A    Number of Children: N/A  . Years of Education: N/A   Occupational History  . landscape architecture    Social History Main Topics  . Smoking status: Former Smoker -- 0.25 packs/day    Quit date: 03/29/2011  . Smokeless tobacco: Never Used  . Alcohol Use: No  . Drug Use: No  . Sexual Activity: Not on file   Other Topics Concern  . Not on file   Social History Narrative   Lives at home by himself, single.  Exercise - stretching, walking, running, weights.  [employer]. Mult partners, MSM, condom use.  Manufacturing engineer, [employer].    Family History  Problem Relation Age of Onset  . Hypertension Paternal Grandmother   . Heart disease Paternal Grandfather     died in 49s of heart disease  . Diabetes Neg Hx   . Cancer Other     great grandmother lung cancer  .  Asthma Sister   . Stroke Maternal Grandmother   . Cancer Mother     breast cancer  . Cancer Maternal Grandfather 65    prostate    Current outpatient prescriptions: diphenhydrAMINE (BENADRYL) 25 MG tablet, Take 25 mg by mouth every 6 (six) hours as needed., Disp: , Rfl: ;  Docusate Sodium (DULCOLAX STOOL SOFTENER PO), Take by mouth., Disp: , Rfl: ;  Ascorbic Acid (VITAMIN C) 1000 MG tablet, Take 1,000 mg by mouth daily.  , Disp: , Rfl:   No Known Allergies   Review of Systems Constitutional: -fever, -chills, -sweats, -unexpected weight change, -decreased appetite, -fatigue Allergy: -sneezing, -itching, -congestion Dermatology: -changing moles, --rash, -lumps ENT: -runny nose, -ear pain, -sore throat, -hoarseness, -sinus pain, -teeth pain, - ringing in ears, -hearing loss, -nosebleeds Cardiology: -chest pain, -palpitations, -swelling, -difficulty breathing when lying flat, -waking up short of breath Respiratory: -cough, -shortness of breath, -difficulty breathing with exercise or exertion, -wheezing, -coughing up blood Gastroenterology: -abdominal pain, -nausea, -vomiting, -diarrhea, -constipation, -blood in stool, -changes in bowel movement, -difficulty swallowing or eating Hematology: -bleeding, -bruising  Musculoskeletal: -joint aches, -muscle aches, -joint swelling, -back pain, -neck pain, -cramping, -changes in gait Ophthalmology: denies vision changes, eye redness, itching, discharge Urology: -burning with urination, -difficulty urinating, -blood in urine, -urinary frequency, -urgency, -incontinence Neurology: -headache, -weakness, -tingling, -numbness, -memory loss, -falls, -dizziness Psychology: -depressed mood, -  agitation, +sleep problems(snoring, sleep apnea)     Objective:   Physical Exam  BP 100/70 mmHg  Pulse 85  Temp(Src) 98.4 F (36.9 C) (Oral)  Resp 16  Ht 6' (1.829 m)  Wt 173 lb (78.472 kg)  BMI 23.46 kg/m2  General appearance: alert, no distress, WD/WN, white  male  Skin: upper arms bilaterally with patches of brown/pink flat macular lesions that he notes is scar from prior poison ivy dermatitis, scattered benign appearing macules on back and arms, right upper medial orbit of eye with unchanged flesh color 36mm papule HEENT: normocephalic, conjunctiva/corneas normal, sclerae anicteric, PERRLA, EOMi, nares patent, septum deviated to the right, no discharge or erythema, pharynx normal Oral cavity: MMM, tongue normal, teeth in good repair Neck: supple, no lymphadenopathy, no thyromegaly, no masses, normal ROM, no bruits Chest: non tender, normal shape and expansion Heart: RRR, normal S1, S2, no murmurs Lungs: CTA bilaterally, no wheezes, rhonchi, or rales Abdomen: +bs, soft, non tender, non distended, no masses, no hepatomegaly, no splenomegaly, no bruits Back: non tender, normal ROM, no scoliosis Musculoskeletal: upper extremities non tender, no obvious deformity, normal ROM throughout, lower extremities non tender, no obvious deformity, normal ROM throughout Extremities: no edema, no cyanosis, no clubbing Pulses: 2+ symmetric, upper and lower extremities, normal cap refill Neurological: alert, oriented x 3, CN2-12 intact, strength normal upper extremities and lower extremities, sensation normal throughout, DTRs 2+ throughout, no cerebellar signs, gait normal Psychiatric: normal affect, behavior normal, pleasant  GU: normal male external genitalia, circumcised, nontender, no masses, no hernia, no lymphadenopathy Rectal: not examined    Assessment and Plan :    Encounter Diagnoses  Name Primary?  . Encounter for health maintenance examination in adult Yes  . History of sleep apnea   . Need for Tdap vaccination   . Need for prophylactic vaccination and inoculation against influenza   . Screen for STD (sexually transmitted disease)   . Physiological tremor     Physical exam - discussed healthy lifestyle, diet, exercise, preventative care,  vaccinations, and addressed their concerns.   F/u pending labs Hx/o sleep apnea - much improved with lifestyle changes and weight loss and exercise since diagnosis in 02/2014. Counseled on the Tdap (tetanus, diptheria, and acellular pertussis) vaccine.  Vaccine information sheet given. Tdap vaccine given after consent obtained. Counseled on the influenza virus vaccine.  Vaccine information sheet given.  Influenza vaccine given after consent obtained. Based on exam and symptoms, no tremor seen, likely has benign physiologic tremor Follow-up pending labs

## 2014-12-03 NOTE — Patient Instructions (Signed)
  Thank you for giving me the opportunity to serve you today.    Your diagnosis today includes: Encounter Diagnoses  Name Primary?  . Encounter for health maintenance examination in adult Yes  . History of sleep apnea   . Need for Tdap vaccination   . Need for prophylactic vaccination and inoculation against influenza   . Screen for STD (sexually transmitted disease)   . Physiological tremor      Specific recommendations today include:  See eye doctor for baseline evaluation  Return pending labs.   I have included other useful information below for your review.   Tremor Tremor is a rhythmic, involuntary muscular contraction characterized by oscillations (to-and-fro movements) of a part of the body. The most common of all involuntary movements, tremor can affect various body parts such as the hands, head, facial structures, vocal cords, trunk, and legs; most tremors, however, occur in the hands. Tremor often accompanies neurological disorders associated with aging. Although the disorder is not life-threatening, it can be responsible for functional disability and social embarrassment. TREATMENT  There are many types of tremor and several ways in which tremor is classified. The most common classification is by behavioral context or position. There are five categories of tremor within this classification: resting, postural, kinetic, task-specific, and psychogenic. Resting or static tremor occurs when the muscle is at rest, for example when the hands are lying on the lap. This type of tremor is often seen in patients with Parkinson's disease. Postural tremor occurs when a patient attempts to maintain posture, such as holding the hands outstretched. Postural tremors include physiological tremor, essential tremor, tremor with basal ganglia disease (also seen in patients with Parkinson's disease), cerebellar postural tremor, tremor with peripheral neuropathy, post-traumatic tremor, and alcoholic  tremor. Kinetic or intention (action) tremor occurs during purposeful movement, for example during finger-to-nose testing. Task-specific tremor appears when performing goal-oriented tasks such as handwriting, speaking, or standing. This group consists of primary writing tremor, vocal tremor, and orthostatic tremor. Psychogenic tremor occurs in both older and younger patients. The key feature of this tremor is that it dramatically lessens or disappears when the patient is distracted. PROGNOSIS There are some treatment options available for tremor; the appropriate treatment depends on accurate diagnosis of the cause. Some tremors respond to treatment of the underlying condition, for example in some cases of psychogenic tremor treating the patient's underlying mental problem may cause the tremor to disappear. Also, patients with tremor due to Parkinson's disease may be treated with Levodopa drug therapy. Symptomatic drug therapy is available for several other tremors as well. For those cases of tremor in which there is no effective drug treatment, physical measures such as teaching the patient to brace the affected limb during the tremor are sometimes useful. Surgical intervention such as thalamotomy or deep brain stimulation may be useful in certain cases. Document Released: 11/20/2002 Document Revised: 02/22/2012 Document Reviewed: 11/30/2005 [facility] Patient Information 2015 [place], [place]. This information is not intended to replace advice given to you by your health care provider. Make sure you discuss any questions you have with your health care provider.

## 2014-12-04 LAB — RPR

## 2014-12-04 LAB — GC/CHLAMYDIA PROBE AMP
CT Probe RNA: NEGATIVE
GC Probe RNA: NEGATIVE

## 2014-12-04 LAB — HIV ANTIBODY (ROUTINE TESTING W REFLEX): HIV: NONREACTIVE

## 2015-01-03 ENCOUNTER — Ambulatory Visit (INDEPENDENT_AMBULATORY_CARE_PROVIDER_SITE_OTHER): Payer: 59 | Admitting: Medical

## 2015-01-03 ENCOUNTER — Encounter: Payer: Self-pay | Admitting: Medical

## 2015-01-03 VITALS — BP 130/70 | HR 76 | Temp 98.2°F | Resp 15 | Wt 177.0 lb

## 2015-01-03 DIAGNOSIS — L989 Disorder of the skin and subcutaneous tissue, unspecified: Secondary | ICD-10-CM

## 2015-01-03 DIAGNOSIS — A630 Anogenital (venereal) warts: Secondary | ICD-10-CM

## 2015-01-03 NOTE — Progress Notes (Signed)
Subjective: Here for skin lesion of left groin area that appeared several days ago, raised, concern for wart. He has had prior similar that we excised here. No new recent sexual partners  No other complaint  Objective: Gen.: Well developed well-nourished no acute distress Skin: Left groin area adjacent to scrotum in between scrotum and upper inner thigh with 3 mm x 2 mm raised verrucal lesion   Assessment: Encounter Diagnoses  Name Primary?  . Genital warts Yes  . Changing skin lesion     Plan: Discussed findings, diagnosis, treatment options. He consents and request excision. Using 1% lidocaine with epinephrine for local anesthesia of left groin skin adjacent to scrotum, use Hyfrecator to completely destroy the lesion and the base. Discussed wound care, follow-up when necessary patient tolerated procedure well

## 2015-01-29 ENCOUNTER — Ambulatory Visit (INDEPENDENT_AMBULATORY_CARE_PROVIDER_SITE_OTHER): Payer: 59 | Admitting: Medical

## 2015-01-29 ENCOUNTER — Encounter: Payer: Self-pay | Admitting: Medical

## 2015-01-29 VITALS — BP 110/80 | HR 76 | Temp 98.6°F | Resp 16 | Wt 176.0 lb

## 2015-01-29 DIAGNOSIS — L237 Allergic contact dermatitis due to plants, except food: Secondary | ICD-10-CM

## 2015-01-29 MED ORDER — TRIAMCINOLONE ACETONIDE 0.1 % EX CREA: 1.0000 "application " | TOPICAL_CREAM | Freq: Two times a day (BID) | CUTANEOUS | Status: DC

## 2015-01-29 MED ORDER — METHYLPREDNISOLONE ACETATE 40 MG/ML IJ SUSP
40.0000 mg | Freq: Once | INTRAMUSCULAR | Status: AC
  Administered 2015-01-29: 40 mg via INTRAMUSCULAR

## 2015-01-29 NOTE — Progress Notes (Signed)
Subjective:   Gary Garrett is a 44 y.o. male who presents for evaluation of a rash involving the bilat lower legs.  Concerned for poison ivy as they have recent exposure - yesterday clearing out vegetation.  He works in Biomedical scientist.   Rash started 1 day ago.  Rash is itchy, red, raised.  Patient denies: fever, joint aches, chills, nausea. Patient has not had contacts with similar rash.   Using calamine lotion for the rash.  No other aggravating or relieving factors.  No other c/o.   The following portions of the patient's history were reviewed and updated as appropriate: allergies, current medications, past family history, past medical history, past social history and problem list.  Review of Systems As in subjective above   Objective:   Gen: wd, wn, nad Skin:  bilat lower legs, just below knee anteromedially with few patches of pink/red, round urticarial lesions   Assessment:   Encounter Diagnosis  Name Primary?  . Poison ivy dermatitis Yes      Plan:   Discussed symptoms and exam findings, diagnosis, treatment options.  Etiology appears to be poison ivy dermatitis.  40 mg Depo-Medrol given IM in office after discussing risks/benefits   Prescribed:Triamcinolone steroid cream to apply topically daily for the next several days until this resolves.  Discussed washing contaminated clothing on the hot cycle in the washing machine, washing gloves, shoes, or other utensils in hot soapy water.  Avoid re- exposure.  Discussed signs of infection or worsening symptoms that would prompt recheck.   Follow up prn, or if not much improved in the next 4-5 days.

## 2015-02-04 ENCOUNTER — Encounter: Payer: Self-pay | Admitting: Medical

## 2015-02-04 ENCOUNTER — Other Ambulatory Visit: Payer: Self-pay | Admitting: Medical

## 2015-02-04 MED ORDER — METHYLPREDNISOLONE (PAK) 4 MG PO TABS: ORAL_TABLET | ORAL | Status: DC

## 2015-02-04 NOTE — Telephone Encounter (Signed)
Gary Garrett- patient called and wanted to know the status of this but would like something for his poision oak. Please send something to [facility] @ [address]

## 2015-02-11 ENCOUNTER — Encounter: Payer: Self-pay | Admitting: Medical

## 2015-05-08 ENCOUNTER — Ambulatory Visit: Payer: 59 | Admitting: Medical

## 2015-05-08 ENCOUNTER — Encounter: Payer: Self-pay | Admitting: Medical

## 2015-05-08 ENCOUNTER — Ambulatory Visit (INDEPENDENT_AMBULATORY_CARE_PROVIDER_SITE_OTHER): Payer: 59 | Admitting: Medical

## 2015-05-08 VITALS — BP 100/70 | HR 77 | Resp 15 | Wt 175.0 lb

## 2015-05-08 DIAGNOSIS — N489 Disorder of penis, unspecified: Secondary | ICD-10-CM

## 2015-05-08 DIAGNOSIS — R300 Dysuria: Secondary | ICD-10-CM | POA: Diagnosis not present

## 2015-05-08 DIAGNOSIS — N4889 Other specified disorders of penis: Secondary | ICD-10-CM | POA: Diagnosis not present

## 2015-05-08 DIAGNOSIS — Z8619 Personal history of other infectious and parasitic diseases: Secondary | ICD-10-CM

## 2015-05-08 LAB — POCT URINALYSIS DIPSTICK
Bilirubin, UA: NEGATIVE
Blood, UA: NEGATIVE
Glucose, UA: NEGATIVE
Ketones, UA: NEGATIVE
LEUKOCYTES UA: NEGATIVE
NITRITE UA: NEGATIVE
Protein, UA: NEGATIVE
Spec Grav, UA: 1.02
Urobilinogen, UA: NEGATIVE
pH, UA: 7

## 2015-05-08 NOTE — Progress Notes (Signed)
Subjective: Here for 2 issues  he complains of possible urinary tract infection.  He has had symptoms for 2 days.  Symptoms include some irritations with urination, and no other symptoms. Patient denies back pain, fever and abdominal pain, penile discharge, no new sexual contacts, no concern for STD.  Last UTI was unknown.   Using nothing for current symptoms.   No other aggravating or relieving factors.    He notes a new small growth on top proximal end of penis x few days.    Past Medical History  Diagnosis Date  . Allergy   . GAD (generalized anxiety disorder)   . Asthma     allergen induced asthma  . 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 Tick bite of buttock 04/06/12  . Sleep apnea 3/15    ROS as in subjective  Reviewed allergies, medications, past medical, surgical, and social history.    Objective: Filed Vitals:   05/08/15 1457  BP: 100/70  Pulse: 77  Resp: 15    General appearance: alert, no distress, WD/WN, male Abdomen: +bs, soft, non tender, non distended, no masses, no hepatomegaly, no splenomegaly, no bruits Back: no CVA tenderness GU: proximal dorsal penis with 22mm raised pink lesion, nonspecific, possible wart      Assessment: Encounter Diagnoses  Name Primary?  . Dysuria Yes  . Penile lesion   . History of genital warts      Plan: Dysuria - declines urine culture or STD screen.  UA clear.  Advised good hydration, limit caffeine, and if not improving in a few days, consider culture, repeat UA and STD screen  Discussed risks/benefits of procedure, and patient gave consent for excision/biopsy of cyst.  Prepped skin in usual sterile fashion.  Used 0.3cc of 1% lidocaine with epinephrine for local anesthesia, used sterile razor blade to make a shave biopsy.  Achieved hemostasis with silver nitrate.  Covered wound with sterile bandage and dressing.  EBL 1cc.  Patient tolerated procedure well.  Discussed wound care, and advised f/u pending  pathology report

## 2015-05-20 ENCOUNTER — Encounter: Payer: Self-pay | Admitting: Medical

## 2015-05-22 ENCOUNTER — Telehealth: Payer: Self-pay | Admitting: Medical

## 2015-05-22 NOTE — Telephone Encounter (Signed)
Call about the biopsy.   It was a wart/condyloma as suspected.

## 2015-05-22 NOTE — Telephone Encounter (Signed)
I called pathology and they are suppose to fax the report over to our office

## 2015-05-24 ENCOUNTER — Encounter: Payer: Self-pay | Admitting: Medical

## 2015-05-30 ENCOUNTER — Encounter: Payer: Self-pay | Admitting: Medical

## 2015-06-10 ENCOUNTER — Encounter: Payer: Self-pay | Admitting: Family Medicine

## 2015-06-10 ENCOUNTER — Ambulatory Visit (INDEPENDENT_AMBULATORY_CARE_PROVIDER_SITE_OTHER): Payer: Commercial Managed Care - HMO | Admitting: Family Medicine

## 2015-06-10 VITALS — BP 120/70 | HR 76 | Temp 97.3°F | Wt 174.2 lb

## 2015-06-10 DIAGNOSIS — Z7251 High risk heterosexual behavior: Secondary | ICD-10-CM

## 2015-06-10 DIAGNOSIS — J029 Acute pharyngitis, unspecified: Secondary | ICD-10-CM

## 2015-06-10 LAB — POCT RAPID STREP A (OFFICE): RAPID STREP A SCREEN: NEGATIVE

## 2015-06-10 NOTE — Progress Notes (Signed)
Chief Complaint  Patient presents with  . sore throat    sore throat for about a week. was taking a nap with fan on and woke up with sore throat.   He is complaining of sore throat--"feels like strep".  He denies any runny nose, postnasal drainage, cough.  Denies fevers, chills.  Denies sick contacts. Currently denies any flaring of allergies. Eyes have been a little hazy, so used some drops this morning (visine-type medication).  Reports having unprotected oral sex with men.  He believes he had a gonorrheal infection many many years ago, and would like to be checked for this today. Chart reviewed--negative GC and chlamydia throat cultures in 2013, 2012. I don't have paper chart to look back further. He is anxious about this and wants testing  PMH, PSH, SH reviewed. He has routine HIV screening and urine GC/chlamydia screening done, last in December.  Outpatient Encounter Prescriptions as of 06/10/2015  Medication Sig Note  . diphenhydrAMINE (BENADRYL) 25 MG tablet Take 25 mg by mouth every 6 (six) hours as needed.   . Ascorbic Acid (VITAMIN C) 1000 MG tablet Take 1,000 mg by mouth daily.   09/30/2011: Patient takes this in the winter months   . Docusate Sodium (DULCOLAX STOOL SOFTENER PO) Take by mouth.    No facility-administered encounter medications on file as of 06/10/2015.  (not currently taking stool softener)  No Known Allergies  ROS: Denies nausea, vomiting, diarrhea, bleeding, bruising, rashes. No headaches, dizziness, abdominal pain. No dysuria, penile discharge.  See HPI for URI symptoms. No chest pain, shortness of breath.  PHYSICAL EXAM: BP 120/70 mmHg  Pulse 76  Temp(Src) 97.3 F (36.3 C) (Tympanic)  Wt 174 lb 3.2 oz (79.017 kg)  Well appearing, slightly anxious male, in no distress HEENT: PERRL EOMI, conjunctiva clear.  TM's and EAC's normal. Nasal mucosa is moderately edematous with clear mucus Throat is completely normal in appearance--tonsils are very small, no  erythema, exudate, ulcers or other mucosal lesions noted. No purulent drainage noted. Sinuses are nontender Neck: no lymphadenopathy Heart: regular rate and rhythm without murmur Lungs: clear bilaterally  Rapid strep negative  ASSESSMENT/PLAN:  Acute pharyngitis, unspecified pharyngitis type - suspect viral (vs allergic) related to PND.  do not suspect strep or GC/hlamydia based on exam - Plan: Chlamydia culture, Gonococcus culture, Throat culture, Rapid Strep A, CANCELED: Throat culture, CANCELED: Gonococcus culture, CANCELED: Chlamydia culture  High risk sexual behavior  Encouraged safe sexual behavior, condom use.  Supportive measures for treatment of sore throat reviewed.

## 2015-06-10 NOTE — Patient Instructions (Signed)

## 2015-06-12 LAB — CULTURE, GROUP A STREP
Culture: NORMAL
ORGANISM ID, BACTERIA: NORMAL

## 2015-06-13 LAB — GONOCOCCUS CULTURE

## 2015-06-14 LAB — CHLAMYDIA CULTURE

## 2015-10-27 ENCOUNTER — Ambulatory Visit (INDEPENDENT_AMBULATORY_CARE_PROVIDER_SITE_OTHER): Payer: Commercial Managed Care - HMO

## 2015-10-27 ENCOUNTER — Ambulatory Visit (INDEPENDENT_AMBULATORY_CARE_PROVIDER_SITE_OTHER): Payer: Commercial Managed Care - HMO | Admitting: Family Medicine

## 2015-10-27 VITALS — BP 108/62 | HR 75 | Temp 98.4°F | Resp 16 | Ht 72.0 in | Wt 175.0 lb

## 2015-10-27 DIAGNOSIS — S92912A Unspecified fracture of left toe(s), initial encounter for closed fracture: Secondary | ICD-10-CM | POA: Diagnosis not present

## 2015-10-27 DIAGNOSIS — M79675 Pain in left toe(s): Secondary | ICD-10-CM

## 2015-10-27 DIAGNOSIS — M25572 Pain in left ankle and joints of left foot: Secondary | ICD-10-CM

## 2015-10-27 MED ORDER — HYDROCODONE-ACETAMINOPHEN 5-325 MG PO TABS: 1.0000 | ORAL_TABLET | Freq: Four times a day (QID) | ORAL | Status: DC | PRN

## 2015-10-27 NOTE — Progress Notes (Signed)
Chief Complaint:  Chief Complaint  Patient presents with  . Toe Injury    Hit left pinky toe this AM, thinks it may be broken    HPI: Gary Garrett is a 44 y.o. male who reports to [facility] today complaining of left 5th toe pain radiating up to latearal aspect of foot, sharp throbbing pain after hitting his foot agaisnt door molding this AM. He deneis any weaknees   Past Medical History  Diagnosis Date  . Allergy   . GAD (generalized anxiety disorder)   . Asthma     allergen induced asthma  . 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 Tick bite of buttock 04/06/12  . Sleep apnea 3/15   Past Surgical History  Procedure Laterality Date  . Wisdom tooth extraction     Social History   Social History  . Marital Status: Single    Spouse Name: N/A  . Number of Children: N/A  . Years of Education: N/A   Occupational History  . landscape architecture    Social History Main Topics  . Smoking status: Former Smoker -- 0.25 packs/day    Quit date: 03/29/2011  . Smokeless tobacco: Never Used  . Alcohol Use: No  . Drug Use: No  . Sexual Activity: Not Asked   Other Topics Concern  . None   Social History Narrative   Lives at home by himself, single.  Exercise - stretching, walking, running, weights.  [employer]. Mult partners, MSM, condom use.  Manufacturing engineer, [employer].   Family History  Problem Relation Age of Onset  . Hypertension Paternal Grandmother   . Heart disease Paternal Grandfather     died in 13s of heart disease  . Diabetes Neg Hx   . Cancer Other     great grandmother lung cancer  . Asthma Sister   . Stroke Maternal Grandmother   . Cancer Mother     breast cancer  . Cancer Maternal Grandfather 90    prostate   No Known Allergies Prior to Admission medications   Medication Sig Start Date End Date Taking? Authorizing Provider  diphenhydrAMINE (BENADRYL) 25 MG tablet Take 25 mg by mouth every 6 (six) hours as needed.    Yes Historical Provider, MD  Docusate Sodium (DULCOLAX STOOL SOFTENER PO) Take by mouth.   Yes Historical Provider, MD  Ascorbic Acid (VITAMIN C) 1000 MG tablet Take 1,000 mg by mouth daily.      Historical Provider, MD     ROS: The patient denies fevers, chills, night sweats, unintentional weight loss, chest pain, palpitations, wheezing, dyspnea on exertion, nausea, vomiting, abdominal pain, dysuria, hematuria, melena, numbness, weakness, or + tingling.   All other systems have been reviewed and were otherwise negative with the exception of those mentioned in the HPI and as above.    PHYSICAL EXAM: Filed Vitals:   10/27/15 0910  BP: 108/62  Pulse: 75  Temp: 98.4 F (36.9 C)  Resp: 16   Body mass index is 23.73 kg/(m^2).   General: Alert, no acute distress HEENT:  Normocephalic, atraumatic, oropharynx patent. EOMI, PERRLA Cardiovascular:  Regular rate and rhythm, no rubs murmurs or gallops.  No Carotid bruits, radial pulse intact. No pedal edema.  Respiratory: Clear to auscultation bilaterally.  No wheezes, rales, or rhonchi.  No cyanosis, no use of accessory musculature Abdominal: No organomegaly, abdomen is soft and non-tender, positive bowel sounds. No masses. Skin: No rashes.  Neurologic: Facial musculature symmetric. Psychiatric: Patient acts appropriately throughout our interaction. Lymphatic: No cervical or submandibular lymphadenopathy Musculoskeletal: Gait intact.  + tender, swelling, no ecchymosis on left 5th toe   LABS: Results for orders placed or performed in visit on 06/10/15  Chlamydia culture  Result Value Ref Range   Chlamydia trachomatis Cult REPORT    Source Not Provided   Gonococcus culture  Result Value Ref Range   Organism ID, Bacteria No Neisseria gonorrhoeae Isolated   Culture, Group A Strep  Result Value Ref Range   Culture      Normal Upper Respiratory Flora No Beta Hemolytic Streptococci Isolated No Neisseria gonorrhoeae Isolated     Organism ID, Bacteria Normal Upper Respiratory Flora    Organism ID, Bacteria No Beta Hemolytic Streptococci Isolated   Rapid Strep A  Result Value Ref Range   Rapid Strep A Screen Negative Negative     EKG/XRAY:   Primary read interpreted by Dr. Marin Comment at [facility]. + minimilally displaced proximal phalanx 5th toe    ASSESSMENT/PLAN: Encounter Diagnoses  Name Primary?  . Toe pain, left Yes  . Pain in joint, ankle and foot, left    Post op shoe Fu with PCP for repeat xray in 1 week Rx norco  Gross sideeffects, risk and benefits, and alternatives of medications d/w patient. Patient is aware that all medications have potential sideeffects and we are unable to predict every sideeffect or drug-drug interaction that may occur.  Laurey Salser DO  10/27/2015 9:23 AM

## 2015-10-28 ENCOUNTER — Telehealth: Payer: Self-pay | Admitting: Medical

## 2015-10-28 ENCOUNTER — Encounter: Payer: Self-pay | Admitting: Medical

## 2015-10-28 NOTE — Telephone Encounter (Signed)
He can f/u here as we can send to [place] [facility] for repeat xray if needed.   What is he doing to splint his toe?  What treatment did Urgent Care recommend?

## 2015-10-28 NOTE — Telephone Encounter (Signed)
Pt called and stated that he went to the urgent care on 10/27/2015 because he broke his pinky toe, all the notes are in epic , he had a xray done, they want him to come in for a a follow up in a week i made that appt, they also wanted him to come in to have another xray i told him that we didn't do xray here and that we would have to send him some where  [facility] for that, he said to let him to know if he still  Needed to come in for that appt since he would have to go somewhere else for a a xray. Please advise pt on what to do, pt said he would be glad to come in for appt, pt can be reached at [phone].

## 2015-10-29 NOTE — Telephone Encounter (Signed)
Buddy tape splint is fine, elevation of foot when possible, ice when possible. F/u as scheduled otherwise

## 2015-10-29 NOTE — Telephone Encounter (Signed)
[name] that it is getting better, said that the urgent care wanted to do the buddy thing with the neighboring toe. He was to swollen and in pain so he would not Hes wearing a compression shoe and he has it super tight so that the toe is not moving. [name] he has not taken it off at all not even in his sleep. [name] that he has made a f/up appt with you on 11/04/15 with you to talk about transiting back to normal shoe. Wants to know what you want him to do?

## 2015-10-29 NOTE — Telephone Encounter (Signed)
Pt informed

## 2015-11-04 ENCOUNTER — Encounter: Payer: Self-pay | Admitting: Medical

## 2015-11-04 ENCOUNTER — Ambulatory Visit (INDEPENDENT_AMBULATORY_CARE_PROVIDER_SITE_OTHER): Payer: Commercial Managed Care - HMO | Admitting: Medical

## 2015-11-04 VITALS — BP 102/80 | HR 78 | Wt 178.0 lb

## 2015-11-04 DIAGNOSIS — S92912D Unspecified fracture of left toe(s), subsequent encounter for fracture with routine healing: Secondary | ICD-10-CM | POA: Diagnosis not present

## 2015-11-04 NOTE — Progress Notes (Signed)
Subjective: Chief Complaint  Patient presents with  . Follow-up    on pinky toe. started the buddy toe wrap. sait it is feeling better.    Here for toe fracture f/u.   Was seen initially 10/27/15 at Urgent Care after he stumped his left small toe at home.   Had xray, was advised of toe fracture, was given initial recommendations which he has been using including buddy taping, post op shoe, ice, OTC NSAID.  Improved in terms of pain.  No other aggravating or relieving factors. No other complaint.   Objective: BP 102/80 mmHg  Pulse 78  Wt 178 lb (80.74 kg)  Gen: wd, wn, nad Skin:  Left foot from 3rd to 5th toe with yellowish bruising from distal toe to 1/3 of anterior foot, some purplish bruising of left 5th toe laterally and similar purplish bruising of lateral side of 4th toe MSK:  No obvious tenderness of left foot or toes with palpation, ROM seems ok. Toes and feet neurovascularly intact   10/27/15 xray from urgent care  CLINICAL DATA: The patient struck his left fifth toe on a door frame while walking with onset of pain and swelling. Initial encounter.  EXAM: LEFT FOOT - COMPLETE 3+ VIEW  COMPARISON: None.  FINDINGS: The patient has a fracture through the distal metaphysis of the proximal phalanx of the left little toe. The fracture shows mild lateral and dorsal displacement. No other fracture is identified.  IMPRESSION: Mildly displaced fracture distal metaphysis proximal phalanx left little toe     Assessment: Encounter Diagnosis  Name Primary?  . Fracture of toe, left, with routine healing, subsequent encounter Yes     Plan: Discussed case with Dr. Redmond School, supervising physician.   C/t post op shoe another 2 weeks, can use ice/heat alternatively this week.  Can use NSAID OTC prn, and avoid re injury.  Can use elliptical or light walking for exercise, but don't resume running until close to the week of Christmas to avoid ffurtherinjury to the left small  toe.   He understands and agrees to plan.

## 2015-11-25 ENCOUNTER — Telehealth: Payer: Self-pay | Admitting: Medical

## 2015-11-25 ENCOUNTER — Encounter: Payer: Self-pay | Admitting: Medical

## 2015-11-25 ENCOUNTER — Ambulatory Visit (INDEPENDENT_AMBULATORY_CARE_PROVIDER_SITE_OTHER): Payer: Commercial Managed Care - HMO | Admitting: Medical

## 2015-11-25 VITALS — BP 110/70 | HR 80 | Resp 14 | Ht 72.0 in | Wt 180.4 lb

## 2015-11-25 DIAGNOSIS — Z23 Encounter for immunization: Secondary | ICD-10-CM | POA: Diagnosis not present

## 2015-11-25 DIAGNOSIS — F419 Anxiety disorder, unspecified: Secondary | ICD-10-CM | POA: Diagnosis not present

## 2015-11-25 DIAGNOSIS — Z113 Encounter for screening for infections with a predominantly sexual mode of transmission: Secondary | ICD-10-CM

## 2015-11-25 DIAGNOSIS — S92919A Unspecified fracture of unspecified toe(s), initial encounter for closed fracture: Secondary | ICD-10-CM | POA: Insufficient documentation

## 2015-11-25 DIAGNOSIS — G479 Sleep disorder, unspecified: Secondary | ICD-10-CM | POA: Diagnosis not present

## 2015-11-25 DIAGNOSIS — Z0000 Encounter for general adult medical examination without abnormal findings: Secondary | ICD-10-CM | POA: Diagnosis not present

## 2015-11-25 DIAGNOSIS — S92912D Unspecified fracture of left toe(s), subsequent encounter for fracture with routine healing: Secondary | ICD-10-CM

## 2015-11-25 LAB — POCT URINALYSIS DIPSTICK
Bilirubin, UA: NEGATIVE
Blood, UA: NEGATIVE
Glucose, UA: NEGATIVE
KETONES UA: NEGATIVE
Leukocytes, UA: NEGATIVE
Nitrite, UA: NEGATIVE
PH UA: 6
PROTEIN UA: NEGATIVE
Urobilinogen, UA: NEGATIVE

## 2015-11-25 LAB — CBC
HCT: 42.6 % (ref 39.0–52.0)
HEMOGLOBIN: 14.4 g/dL (ref 13.0–17.0)
MCH: 28.9 pg (ref 26.0–34.0)
MCHC: 33.8 g/dL (ref 30.0–36.0)
MCV: 85.5 fL (ref 78.0–100.0)
MPV: 8.9 fL (ref 8.6–12.4)
Platelets: 236 10*3/uL (ref 150–400)
RBC: 4.98 MIL/uL (ref 4.22–5.81)
RDW: 13.6 % (ref 11.5–15.5)
WBC: 5.2 10*3/uL (ref 4.0–10.5)

## 2015-11-25 LAB — COMPREHENSIVE METABOLIC PANEL
ALT: 14 U/L (ref 9–46)
AST: 14 U/L (ref 10–40)
Albumin: 4.3 g/dL (ref 3.6–5.1)
Alkaline Phosphatase: 36 U/L — ABNORMAL LOW (ref 40–115)
BUN: 17 mg/dL (ref 7–25)
CALCIUM: 9.3 mg/dL (ref 8.6–10.3)
CO2: 25 mmol/L (ref 20–31)
Chloride: 104 mmol/L (ref 98–110)
Creat: 0.68 mg/dL (ref 0.60–1.35)
GLUCOSE: 92 mg/dL (ref 65–99)
POTASSIUM: 4.3 mmol/L (ref 3.5–5.3)
Sodium: 140 mmol/L (ref 135–146)
Total Bilirubin: 0.4 mg/dL (ref 0.2–1.2)
Total Protein: 6.8 g/dL (ref 6.1–8.1)

## 2015-11-25 LAB — LIPID PANEL
CHOLESTEROL: 145 mg/dL (ref 125–200)
HDL: 52 mg/dL (ref >=40)
LDL CALC: 84 mg/dL (ref <130)
Total CHOL/HDL Ratio: 2.8 Ratio (ref <=5.0)
Triglycerides: 44 mg/dL (ref <150)
VLDL: 9 mg/dL (ref <30)

## 2015-11-25 NOTE — Progress Notes (Signed)
Subjective:   HPI  Gary Garrett is a 44 y.o. male who presents for a complete physical.  Here for physical.  Only concern is possibly repeating sleep study.   Still snores loud and does feel fatigue sometimes.  Is trying to maintain his weight through healthy diet and exercise.   Reviewed their medical, surgical, family, social, medication, and allergy history and updated chart as appropriate.  Past Medical History  Diagnosis Date  . Allergy   . GAD (generalized anxiety disorder)   . Asthma     allergen induced asthma  . 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 Tick bite of buttock 04/06/12  . Sleep apnea 3/15  . Former smoker     prior occasional smoker    Past Surgical History  Procedure Laterality Date  . Wisdom tooth extraction      Social History   Social History  . Marital Status: Single    Spouse Name: N/A  . Number of Children: N/A  . Years of Education: N/A   Occupational History  . landscape architecture    Social History Main Topics  . Smoking status: Former Smoker -- 0.25 packs/day    Quit date: 03/29/2011  . Smokeless tobacco: Never Used  . Alcohol Use: No  . Drug Use: No  . Sexual Activity: Not on file   Other Topics Concern  . Not on file   Social History Narrative   Lives at home by himself, single.  Exercise - stretching, walking, running, weights.  [employer]. Mult partners, MSM, condom use.  Manufacturing engineer, [employer].    Family History  Problem Relation Age of Onset  . Hypertension Paternal Grandmother   . Heart disease Paternal Grandfather     died in 67s of heart disease  . Diabetes Neg Hx   . Cancer Other     great grandmother lung cancer  . Asthma Sister   . Stroke Maternal Grandmother   . Cancer Mother     breast cancer  . Cancer Maternal Grandfather 60    prostate     Current outpatient prescriptions:  .  Ascorbic Acid (VITAMIN C) 1000 MG tablet, Take 1,000 mg by mouth daily.  , Disp: ,  Rfl:  .  diphenhydrAMINE (BENADRYL) 25 MG tablet, Take 25 mg by mouth every 6 (six) hours as needed., Disp: , Rfl:  .  Docusate Sodium (DULCOLAX STOOL SOFTENER PO), Take by mouth., Disp: , Rfl:   No Known Allergies   Review of Systems Constitutional: -fever, -chills, -sweats, -unexpected weight change, -decreased appetite, -fatigue Allergy: -sneezing, -itching, -congestion Dermatology: -changing moles, --rash, -lumps ENT: -runny nose, -ear pain, -sore throat, -hoarseness, -sinus pain, -teeth pain, - ringing in ears, -hearing loss, -nosebleeds Cardiology: -chest pain, -palpitations, -swelling, -difficulty breathing when lying flat, -waking up short of breath Respiratory: -cough, -shortness of breath, -difficulty breathing with exercise or exertion, -wheezing, -coughing up blood Gastroenterology: -abdominal pain, -nausea, -vomiting, -diarrhea, -constipation, -blood in stool, -changes in bowel movement, -difficulty swallowing or eating Hematology: -bleeding, -bruising  Musculoskeletal: -joint aches, -muscle aches, -joint swelling, -back pain, -neck pain, -cramping, -changes in gait Ophthalmology: denies vision changes, eye redness, itching, discharge Urology: -burning with urination, -difficulty urinating, -blood in urine, -urinary frequency, -urgency, -incontinence Neurology: -headache, -weakness, -tingling, -numbness, -memory loss, -falls, -dizziness Psychology: -depressed mood, -agitation, +sleep problems(snoring, sleep apnea)     Objective:   Physical Exam  BP 110/70 mmHg  Pulse 80  Resp  14  Ht 6' (1.829 m)  Wt 180 lb 6.4 oz (81.829 kg)  BMI 24.46 kg/m2  General appearance: alert, no distress, WD/WN, white male  Skin: upper arms bilaterally with patches of brown/pink flat macular lesions that he notes is scar from prior poison ivy dermatitis, scattered benign appearing macules on back and arms, right upper medial orbit of eye with unchanged flesh color 29mm papule, right  superior/posterior shoulder with 1cm round raised cystic lesion unchanged from a year ago HEENT: normocephalic, conjunctiva/corneas normal, sclerae anicteric, PERRLA, EOMi, nares patent, septum deviated to the right, no discharge or erythema, pharynx normal Oral cavity: MMM, tongue normal, teeth in good repair Neck: supple, no lymphadenopathy, no thyromegaly, no masses, normal ROM, no bruits Chest: non tender, normal shape and expansion Heart: RRR, normal S1, S2, no murmurs Lungs: CTA bilaterally, no wheezes, rhonchi, or rales Abdomen: +bs, soft,right lower abdomen oval scar from prior skin biopsy, non tender, non distended, no masses, no hepatomegaly, no splenomegaly, no bruits Back: non tender, normal ROM, no scoliosis Musculoskeletal: upper extremities non tender, no obvious deformity, normal ROM throughout, lower extremities non tender, no obvious deformity, normal ROM throughout Extremities: no edema, no cyanosis, no clubbing Pulses: 2+ symmetric, upper and lower extremities, normal cap refill Neurological: alert, oriented x 3, CN2-12 intact, strength normal upper extremities and lower extremities, sensation normal throughout, DTRs 2+ throughout, no cerebellar signs, gait normal Psychiatric: normal affect, behavior normal, pleasant  GU: normal male external genitalia, circumcised, nontender, no masses, no hernia, no lymphadenopathy Rectal: not examined    Assessment and Plan :    Encounter Diagnoses  Name Primary?  . Routine general medical examination at a health care facility Yes  . Anxiety   . Toe fracture, left, with routine healing, subsequent encounter   . Sleep disturbance   . Screen for STD (sexually transmitted disease)   . Need for influenza vaccination    Physical exam - discussed healthy lifestyle, diet, exercise, preventative care, vaccinations, and addressed their concerns.   Anxiety - no particular current issues. Toe fracture - advised he can stop buddy taping  now, can use elliptical or stationary bike for exercise, but avoid running for another 2 weeks.    Sleep disturbance, hx/o sleep apnea on prior study 02/2014.  Plan to repeat study at this time.  C/t healthy diet and exercise Counseled on the influenza virus vaccine.  Vaccine information sheet given.  Influenza vaccine given after consent obtained. routine and STD labs today Follow-up pending labs

## 2015-11-25 NOTE — Addendum Note (Signed)
Addended by: Deforest Hoyles D on: 11/25/2015 11:21 AM   Modules accepted: Orders

## 2015-11-25 NOTE — Telephone Encounter (Signed)
Refer back to [facility] for repeat sleep study.  Last one was within past 2 years, showing severe sleep apnea, but he lots a lot of weight, wants to recheck given loud snoring and some fatigue.  He is not using CPAP currently

## 2015-11-25 NOTE — Telephone Encounter (Signed)
Referral form faxed to [facility]

## 2015-11-26 LAB — GC/CHLAMYDIA PROBE AMP
CT Probe RNA: NOT DETECTED
GC Probe RNA: NOT DETECTED

## 2015-11-26 LAB — RPR

## 2015-11-26 LAB — HIV ANTIBODY (ROUTINE TESTING W REFLEX): HIV: NONREACTIVE

## 2016-03-04 ENCOUNTER — Encounter: Payer: Self-pay | Admitting: Medical

## 2016-03-04 ENCOUNTER — Ambulatory Visit (INDEPENDENT_AMBULATORY_CARE_PROVIDER_SITE_OTHER): Payer: Commercial Managed Care - HMO | Admitting: Medical

## 2016-03-04 VITALS — BP 106/70 | HR 87 | Temp 97.8°F | Resp 16 | Wt 178.0 lb

## 2016-03-04 DIAGNOSIS — J029 Acute pharyngitis, unspecified: Secondary | ICD-10-CM

## 2016-03-04 DIAGNOSIS — Z202 Contact with and (suspected) exposure to infections with a predominantly sexual mode of transmission: Secondary | ICD-10-CM | POA: Diagnosis not present

## 2016-03-04 LAB — POCT RAPID STREP A (OFFICE): Rapid Strep A Screen: NEGATIVE

## 2016-03-04 NOTE — Progress Notes (Signed)
Subjective: Chief Complaint  Patient presents with  . Sore Throat    wanted to leave urine sample. sore throat started this weekend. doesnt feel like he has any other symptoms.    Here for sore throat.  Had to do a talk on Saturday, had to talk for 2 hours straight.  Has had sore throat for 4+ days.   Been using warm fluids to help.  Not sure if strep, STD, allergies.   Feels real raw in the throat.  Denies runny nose, no sneezing, no itchy eyes, no itchy ears.  No body aches, chills, ear pain.  No cough.  Taking benadryl every night.  No other aggravating or relieving factors. No other complaint.  Past Medical History  Diagnosis Date  . Allergy   . GAD (generalized anxiety disorder)   . Asthma     allergen induced asthma  . 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 Tick bite of buttock 04/06/12  . Sleep apnea 3/15  . Former smoker     prior occasional smoker   ROS as in subjective   Objective: BP 106/70 mmHg  Pulse 87  Temp(Src) 97.8 F (36.6 C) (Tympanic)  Resp 16  Wt 178 lb (80.74 kg)  SpO2 99%  General appearance: alert, no distress, WD/WN HEENT: normocephalic, sclerae anicteric, TMs pearly, nares patent, no discharge or erythema, pharynx with erythema Oral cavity: MMM, no lesions Neck: supple, no lymphadenopathy, no thyromegaly, no masses Lungs: CTA bilaterally, no wheezes, rhonchi, or rales   Assessment: Encounter Diagnoses  Name Primary?  . Sore throat Yes  . Venereal disease contact     Plan: Strep swab negative, will send for GC/Chlamydia swab to rule out STD given sexual preference and possible risk factors.   For now treat as viral pharyngitis with supportive measures as dicussed.

## 2016-03-07 LAB — GC/CHLAMYDIA PROBE, AMP (THROAT)
Chlamydia trachomatis RNA: NOT DETECTED
NEISSERIA GONORRHOEAE RNA (THROAT): NOT DETECTED

## 2016-08-25 ENCOUNTER — Ambulatory Visit (INDEPENDENT_AMBULATORY_CARE_PROVIDER_SITE_OTHER): Payer: Commercial Managed Care - HMO | Admitting: Medical

## 2016-08-25 ENCOUNTER — Encounter: Payer: Self-pay | Admitting: Medical

## 2016-08-25 VITALS — BP 120/80 | HR 75 | Temp 98.5°F | Resp 16 | Wt 185.0 lb

## 2016-08-25 DIAGNOSIS — N50811 Right testicular pain: Secondary | ICD-10-CM | POA: Diagnosis not present

## 2016-08-25 DIAGNOSIS — Z113 Encounter for screening for infections with a predominantly sexual mode of transmission: Secondary | ICD-10-CM | POA: Diagnosis not present

## 2016-08-25 LAB — POCT URINALYSIS DIPSTICK
Bilirubin, UA: NEGATIVE
GLUCOSE UA: NEGATIVE
Ketones, UA: NEGATIVE
Leukocytes, UA: NEGATIVE
NITRITE UA: NEGATIVE
PROTEIN UA: NEGATIVE
RBC UA: NEGATIVE
SPEC GRAV UA: 1.025
UROBILINOGEN UA: NEGATIVE
pH, UA: 6.5

## 2016-08-25 MED ORDER — CIPROFLOXACIN HCL 500 MG PO TABS: 500.0000 mg | ORAL_TABLET | Freq: Two times a day (BID) | ORAL | 0 refills | Status: DC

## 2016-08-25 NOTE — Progress Notes (Signed)
Subjective: Chief Complaint  Patient presents with  . Testicle Pain    Rt testicle pain. Noted last weekend. Described as a dull pain.  Tried Tylenol with no relief.  No lumps noted per pt.    Right testicle has dull pain since this past weekend. doesn't feel lumps, no heavy lifting, no injury.  No rash.  No discharge, no burning with urination or urgency or frequency.  He did drive to [employer] this past weekend, not sure if that caused it.   Since last STD screening, has had new partners.    MGF diagnosed with prostate cancer in late 80s.     Past Medical History:  Diagnosis Date  . Allergy   . Asthma    allergen induced asthma  . 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 Former smoker    prior occasional smoker  . GAD (generalized anxiety disorder)   . Sleep apnea 3/15  . Tick bite of buttock 04/06/12   Past Surgical History:  Procedure Laterality Date  . WISDOM TOOTH EXTRACTION     ROS as in subjective   Objective: BP (!) 130/20   Pulse 75   Temp 98.5 F (36.9 C) (Oral)   Resp 16   Wt 185 lb (83.9 kg)   SpO2 98%   BMI 25.09 kg/m   Gen: wd, wn, nad Abdomen: +bs, soft, non tender, no mass, no organomegaly Back: nonntender GU normal male, circumcised, no tenderness or swelling, no mass, no lymphadenopathy or hernia DRE: prostate WNL    Assessment: Encounter Diagnoses  Name Primary?  . Testicular pain, right Yes  . Screen for STD (sexually transmitted disease)     Plan Discussed possible etiologies.   No sign of testicle lumps.  Begin Cipro empirically, but f/u pending labs.   Andru was seen today for testicle pain.  Diagnoses and all orders for this visit:  Testicular pain, right -     Urinalysis Dipstick -     HIV antibody -     RPR -     GC/Chlamydia Probe Amp  Screen for STD (sexually transmitted disease) -     HIV antibody -     RPR -     GC/Chlamydia Probe Amp  Other orders -     ciprofloxacin (CIPRO) 500 MG tablet;  Take 1 tablet (500 mg total) by mouth 2 (two) times daily.

## 2016-08-26 LAB — GC/CHLAMYDIA PROBE AMP
CT Probe RNA: NOT DETECTED
GC Probe RNA: NOT DETECTED

## 2016-08-26 LAB — HIV ANTIBODY (ROUTINE TESTING W REFLEX): HIV: NONREACTIVE

## 2016-08-26 LAB — RPR

## 2016-09-23 ENCOUNTER — Encounter: Payer: Self-pay | Admitting: Medical

## 2016-09-23 ENCOUNTER — Ambulatory Visit (INDEPENDENT_AMBULATORY_CARE_PROVIDER_SITE_OTHER): Payer: Commercial Managed Care - HMO | Admitting: Medical

## 2016-09-23 VITALS — BP 138/76 | HR 85 | Ht 72.0 in | Wt 184.1 lb

## 2016-09-23 DIAGNOSIS — Z23 Encounter for immunization: Secondary | ICD-10-CM | POA: Diagnosis not present

## 2016-09-23 DIAGNOSIS — A630 Anogenital (venereal) warts: Secondary | ICD-10-CM | POA: Diagnosis not present

## 2016-09-23 NOTE — Progress Notes (Signed)
Subjective: Chief Complaint  Patient presents with  . Acute Visit    area on genitals that he would like to be examined    Here for possible genital warts.  He has had these prior.  A few days ago noticed 2 raised bumps, on on base of penis, one on proximal shaft of penis.   Both lesions are small.  No new sexual partners, no concern for other STD.   No other aggravating or relieving factors. No other complaint.   Past Medical History:  Diagnosis Date  . Allergy   . Asthma    allergen induced asthma  . 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 Former smoker    prior occasional smoker  . GAD (generalized anxiety disorder)   . Sleep apnea 3/15  . Tick bite of buttock 04/06/12   Current Outpatient Prescriptions on File Prior to Visit  Medication Sig Dispense Refill  . Ascorbic Acid (VITAMIN C) 1000 MG tablet Take 1,000 mg by mouth daily. Reported on 03/04/2016    . diphenhydrAMINE (BENADRYL) 25 MG tablet Take 25 mg by mouth every 6 (six) hours as needed.    Mariane Baumgarten Sodium (DULCOLAX STOOL SOFTENER PO) Take by mouth. Reported on 03/04/2016    . FIBER PO Take 1 capsule by mouth daily.     No current facility-administered medications on file prior to visit.    ROS as in subjective  Objective: BP 138/76   Pulse 85   Ht 6' (1.829 m)   Wt 184 lb 2 oz (83.5 kg)   SpO2 98%   BMI 24.97 kg/m   Gen: wd, wn, nad Skin: base of penis dorsally, just left of midline within pubic hair with 35mm raised verruca lesion, and second slightly raised 59mm diameter lesion of dorsal penis, just left of midline approx 1cm away from base of penis No other lesions noted   Assessment: Encounter Diagnoses  Name Primary?  . Genital warts Yes  . Encounter for immunization     Plan: Discussed skin findings.   Patient desires excision.  Discussed risks and benefits of excision.  Cleaned and prepped dorsal penis in usual sterile fashion, used 1% lidocaine without epinephrine for local  anesthesia.   Used forceps and #15 scalpel to excise the 2 lesions on dorsal proximal penis as in exam above.  Used hyphercater to destroy base of lesion and to achieve hemostasis.  Cleaned the wound area.   discussed wound care.  Pt tolerated procedure well.  <3 cc EBL.  Counseled on the influenza virus vaccine.  Vaccine information sheet given.  Influenza vaccine given after consent obtained.

## 2016-10-13 ENCOUNTER — Ambulatory Visit (INDEPENDENT_AMBULATORY_CARE_PROVIDER_SITE_OTHER): Payer: Commercial Managed Care - HMO | Admitting: Urgent Care

## 2016-10-13 VITALS — BP 132/80 | HR 107 | Temp 98.1°F | Resp 17 | Ht 72.0 in | Wt 180.0 lb

## 2016-10-13 DIAGNOSIS — Z7251 High risk heterosexual behavior: Secondary | ICD-10-CM | POA: Diagnosis not present

## 2016-10-13 DIAGNOSIS — K130 Diseases of lips: Secondary | ICD-10-CM

## 2016-10-13 DIAGNOSIS — Z113 Encounter for screening for infections with a predominantly sexual mode of transmission: Secondary | ICD-10-CM | POA: Diagnosis not present

## 2016-10-13 MED ORDER — VALACYCLOVIR HCL 1 G PO TABS: 2000.0000 mg | ORAL_TABLET | Freq: Two times a day (BID) | ORAL | 0 refills | Status: DC

## 2016-10-13 NOTE — Progress Notes (Signed)
° °  By signing my name below, I, Gary Garrett, attest that this documentation has been prepared under the direction and in the presence of Jaynee Eagles, [place].  Electronically Signed: Verlee Monte, Medical Scribe. 10/13/16. 4:57 PM.  MRN: [id number] DOB: Jul 01, 1971  Subjective:   Gary Garrett is a 45 y.o. male presenting for chief complaint of lip lesions onset 2-3 days ago. Reports associated symptoms of mild stinging and tingling but isn't sure if it's more anxiety provoked. Used hydrocortisone for relief of symptoms. Notes that it has improved today. He is sexually active, gets regular STD screens and has protected intercourse but no protection for oral sex. Has not been screened for Hep C. Works outside for [employer] and does think that his lesions might be due to poison ivy. Denies drainage from site, penile discharge, dysuria, penile rash, N/V, abdominal pain and yellowing of the skin.   Current Outpatient Prescriptions:    Ascorbic Acid (VITAMIN C) 1000 MG tablet, Take 1,000 mg by mouth daily. Reported on 03/04/2016, Disp: , Rfl:    diphenhydrAMINE (BENADRYL) 25 MG tablet, Take 25 mg by mouth every 6 (six) hours as needed., Disp: , Rfl:    Docusate Sodium (DULCOLAX STOOL SOFTENER PO), Take by mouth. Reported on 03/04/2016, Disp: , Rfl:    FIBER PO, Take 1 capsule by mouth daily., Disp: , Rfl:   Gary Garrett has No Known Allergies.  Gary Garrett  has a past medical history of Allergy; Asthma; Fever; Former smoker; GAD (generalized anxiety disorder); Sleep apnea (3/15); and Tick bite of buttock (04/06/12). Also  has a past surgical history that includes Wisdom tooth extraction.  Objective:   Vitals: BP 132/80 (BP Location: Right Arm, Patient Position: Sitting, Cuff Size: Large)    Pulse (!) 107    Temp 98.1 F (36.7 C) (Oral)    Resp 17    Ht 6' (1.829 m)    Wt 180 lb (81.6 kg)    SpO2 99%    BMI 24.41 kg/m   Physical Exam  Constitutional: He is oriented to person, place, and time. He appears  well-developed and well-nourished.  Cardiovascular: Normal rate.   Pulmonary/Chest: Effort normal.  Neurological: He is alert and oriented to person, place, and time.  Skin: Rash (right side of mouth) noted. Rash is vesicular.  Cluster of 3 erythematous vesicular like lesions that are non tender, non draining   Assessment and Plan :   1. Screen for STD (sexually transmitted disease) 2. Lesion of lip 3. 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 Labs pending, patient is okay with voice message to [phone].   Jaynee Eagles, PA-C [facility] [phone] 10/13/2016 4:42 PM

## 2016-10-13 NOTE — Patient Instructions (Addendum)
Cold Sore A cold sore (fever blister) is a skin infection caused by the herpes simplex virus (HSV-1). HSV-1 is closely related to the virus that causes genital herpes (HSV-2), but they are not the same even though both viruses can cause oral and genital infections. Cold sores are small, fluid-filled sores inside of the mouth or on the lips, gums, nose, chin, cheeks, or fingers.  The herpes simplex virus can be easily passed (contagious) to other people through close personal contact, such as kissing or sharing personal items. The virus can also spread to other parts of the body, such as the eyes or genitals. Cold sores are contagious until the sores crust over completely. They often heal within 2 weeks.  Once a person is infected, the herpes simplex virus remains permanently in the body. Therefore, there is no cure for cold sores, and they often recur when a person is tired, stressed, sick, or gets too much sun. Additional factors that can cause a recurrence include hormone changes in menstruation or pregnancy, certain drugs, and cold weather.  CAUSES  Cold sores are caused by the herpes simplex virus. The virus is spread from person to person through close contact, such as through kissing, touching the affected area, or sharing personal items such as lip balm, razors, or eating utensils.  SYMPTOMS  The first infection may not cause symptoms. If symptoms develop, the symptoms often go through different stages. Here is how a cold sore develops:   Tingling, itching, or burning is felt 1-2 days before the outbreak.   Fluid-filled blisters appear on the lips, inside the mouth, nose, or on the cheeks.   The blisters start to ooze clear fluid.   The blisters dry up and a yellow crust appears in its place.   The crust falls off.  Symptoms depend on whether it is the initial outbreak or a recurrence. Some other symptoms with the first outbreak may include:   Fever.   Sore throat.   Headache.    Muscle aches.   Swollen neck glands.  DIAGNOSIS  A diagnosis is often made based on your symptoms and looking at the sores. Sometimes, a sore may be swabbed and then examined in the lab to make a final diagnosis. If the sores are not present, blood tests can find the herpes simplex virus.  TREATMENT  There is no cure for cold sores and no vaccine for the herpes simplex virus. Within 2 weeks, most cold sores go away on their own without treatment. Medicines cannot make the infection go away, but medicine can help relieve some of the pain associated with the sores, can work to stop the virus from multiplying, and can also shorten healing time. Medicine may be in the form of creams, gels, pills, or a shot.  HOME CARE INSTRUCTIONS   Only take over-the-counter or prescription medicines for pain, discomfort, or fever as directed by your caregiver. Do not use aspirin.   Use a cotton-tip swab to apply creams or gels to your sores.   Do not touch the sores or pick the scabs. Wash your hands often. Do not touch your eyes without washing your hands first.   Avoid kissing, oral sex, and sharing personal items until sores heal.   Apply an ice pack on your sores for 10-15 minutes to ease any discomfort.   Avoid hot, cold, or salty foods because they may hurt your mouth. Eat a soft, bland diet to avoid irritating the sores. Use a straw to drink   if you have pain when drinking out of a glass.   Keep sores clean and dry to prevent an infection of other tissues.   Avoid the sun and limit stress if these things trigger outbreaks. If sun causes cold sores, apply sunscreen on the lips before being out in the sun.  SEEK MEDICAL CARE IF:   You have a fever or persistent symptoms for more than 2-3 days.   You have a fever and your symptoms suddenly get worse.   You have pus, not clear fluid, coming from the sores.   You have redness that is spreading.   You have pain or irritation in your  eye.   You get sores on your genitals.   Your sores do not heal within 2 weeks.   You have a weakened immune system.   You have frequent recurrences of cold sores.  MAKE SURE YOU:   Understand these instructions.  Will watch your condition.  Will get help right away if you are not doing well or get worse.   This information is not intended to replace advice given to you by your health care provider. Make sure you discuss any questions you have with your health care provider.   Document Released: 11/27/2000 Document Revised: 12/21/2014 Document Reviewed: 04/13/2012 Elsevier Interactive Patient Education 2016 [employer].     IF you received an x-ray today, you will receive an invoice from [facility]. Please contact [facility] at [phone] with questions or concerns regarding your invoice.   IF you received labwork today, you will receive an invoice from [employer]. Please contact [facility] at [phone] with questions or concerns regarding your invoice.   Our billing staff will not be able to assist you with questions regarding bills from these companies.  You will be contacted with the lab results as soon as they are available. The fastest way to get your results is to activate your My Chart account. Instructions are located on the last page of this paperwork. If you have not heard from [place] regarding the results in 2 weeks, please contact this office.

## 2016-10-14 LAB — TRICHOMONAS VAGINALIS RNA, QL,MALES: Trichomonas vaginalis RNA: NOT DETECTED

## 2016-10-14 LAB — RPR

## 2016-10-14 LAB — GC/CHLAMYDIA PROBE AMP
CT Probe RNA: NOT DETECTED
GC Probe RNA: NOT DETECTED

## 2016-10-14 LAB — HIV ANTIBODY (ROUTINE TESTING W REFLEX): HIV 1&2 Ab, 4th Generation: NONREACTIVE

## 2016-10-15 ENCOUNTER — Other Ambulatory Visit: Payer: Self-pay | Admitting: Urgent Care

## 2016-10-15 DIAGNOSIS — K130 Diseases of lips: Secondary | ICD-10-CM

## 2016-10-15 LAB — HERPES SIMPLEX VIRUS CULTURE: ORGANISM ID, BACTERIA: DETECTED

## 2016-10-15 MED ORDER — VALACYCLOVIR HCL 1 G PO TABS: 1000.0000 mg | ORAL_TABLET | Freq: Three times a day (TID) | ORAL | 0 refills | Status: DC

## 2017-10-21 ENCOUNTER — Encounter: Payer: Self-pay | Admitting: Medical

## 2017-10-21 ENCOUNTER — Ambulatory Visit (INDEPENDENT_AMBULATORY_CARE_PROVIDER_SITE_OTHER): Payer: BLUE CROSS/BLUE SHIELD | Admitting: Medical

## 2017-10-21 VITALS — BP 124/80 | HR 77 | Wt 188.6 lb

## 2017-10-21 DIAGNOSIS — R109 Unspecified abdominal pain: Secondary | ICD-10-CM | POA: Diagnosis not present

## 2017-10-21 DIAGNOSIS — Z125 Encounter for screening for malignant neoplasm of prostate: Secondary | ICD-10-CM | POA: Diagnosis not present

## 2017-10-21 DIAGNOSIS — K6289 Other specified diseases of anus and rectum: Secondary | ICD-10-CM

## 2017-10-21 DIAGNOSIS — Z0000 Encounter for general adult medical examination without abnormal findings: Secondary | ICD-10-CM

## 2017-10-21 DIAGNOSIS — Z23 Encounter for immunization: Secondary | ICD-10-CM

## 2017-10-21 DIAGNOSIS — Z113 Encounter for screening for infections with a predominantly sexual mode of transmission: Secondary | ICD-10-CM

## 2017-10-21 DIAGNOSIS — R208 Other disturbances of skin sensation: Secondary | ICD-10-CM

## 2017-10-21 LAB — POCT URINALYSIS DIP (PROADVANTAGE DEVICE)
BILIRUBIN UA: NEGATIVE mg/dL
Bilirubin, UA: NEGATIVE
Glucose, UA: NEGATIVE mg/dL
LEUKOCYTES UA: NEGATIVE
NITRITE UA: NEGATIVE
PH UA: 6.5 (ref 5.0–8.0)
PROTEIN UA: NEGATIVE mg/dL
RBC UA: NEGATIVE
Specific Gravity, Urine: 1.02
Urobilinogen, Ur: NEGATIVE

## 2017-10-21 MED ORDER — HYDROCORTISONE 2.5 % RE CREA: 1.0000 "application " | TOPICAL_CREAM | Freq: Two times a day (BID) | RECTAL | 0 refills | Status: DC
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
## 2017-10-22 LAB — COMPREHENSIVE METABOLIC PANEL
AG RATIO: 1.8 (calc) (ref 1.0–2.5)
ALBUMIN MSPROF: 4.6 g/dL (ref 3.6–5.1)
ALT: 51 U/L — ABNORMAL HIGH (ref 9–46)
AST: 22 U/L (ref 10–40)
Alkaline phosphatase (APISO): 37 U/L — ABNORMAL LOW (ref 40–115)
BUN: 17 mg/dL (ref 7–25)
CHLORIDE: 103 mmol/L (ref 98–110)
CO2: 26 mmol/L (ref 20–32)
CREATININE: 0.9 mg/dL (ref 0.60–1.35)
Calcium: 9.3 mg/dL (ref 8.6–10.3)
GLOBULIN: 2.5 g/dL (ref 1.9–3.7)
GLUCOSE: 104 mg/dL — AB (ref 65–99)
POTASSIUM: 4.1 mmol/L (ref 3.5–5.3)
SODIUM: 137 mmol/L (ref 135–146)
Total Bilirubin: 0.5 mg/dL (ref 0.2–1.2)
Total Protein: 7.1 g/dL (ref 6.1–8.1)

## 2017-10-22 LAB — LIPID PANEL
Cholesterol: 190 mg/dL (ref <200)
HDL: 43 mg/dL (ref >40)
LDL Cholesterol (Calc): 126 mg/dL (calc) — ABNORMAL HIGH
NON-HDL CHOLESTEROL (CALC): 147 mg/dL — AB (ref <130)
TRIGLYCERIDES: 106 mg/dL (ref <150)
Total CHOL/HDL Ratio: 4.4 (calc) (ref <5.0)

## 2017-10-22 LAB — CBC
HEMATOCRIT: 46.5 % (ref 38.5–50.0)
HEMOGLOBIN: 16.1 g/dL (ref 13.2–17.1)
MCH: 30.2 pg (ref 27.0–33.0)
MCHC: 34.6 g/dL (ref 32.0–36.0)
MCV: 87.2 fL (ref 80.0–100.0)
MPV: 9.3 fL (ref 7.5–12.5)
Platelets: 278 10*3/uL (ref 140–400)
RBC: 5.33 10*6/uL (ref 4.20–5.80)
RDW: 12.7 % (ref 11.0–15.0)
WBC: 4.1 10*3/uL (ref 3.8–10.8)

## 2017-10-22 LAB — C. TRACHOMATIS/N. GONORRHOEAE RNA
C. trachomatis RNA, TMA: NOT DETECTED
N. gonorrhoeae RNA, TMA: NOT DETECTED

## 2017-10-22 LAB — PSA: PSA: 0.6 ng/mL (ref <=4.0)

## 2017-10-22 LAB — HIV ANTIBODY (ROUTINE TESTING W REFLEX): HIV 1&2 Ab, 4th Generation: NONREACTIVE

## 2017-10-22 LAB — RPR: RPR Ser Ql: NONREACTIVE

## 2017-10-25 ENCOUNTER — Encounter: Payer: Self-pay | Admitting: Medical

## 2017-11-01 ENCOUNTER — Telehealth: Payer: Self-pay | Admitting: Medical

## 2017-11-01 ENCOUNTER — Other Ambulatory Visit: Payer: Self-pay | Admitting: Medical

## 2017-11-01 DIAGNOSIS — R1084 Generalized abdominal pain: Secondary | ICD-10-CM

## 2017-11-01 NOTE — Telephone Encounter (Signed)
Per my chart message, he is still having abdominal pain.   Please set up for CT abdomen

## 2017-11-02 ENCOUNTER — Encounter: Payer: Self-pay | Admitting: Medical

## 2017-11-02 ENCOUNTER — Other Ambulatory Visit: Payer: Self-pay | Admitting: Medical

## 2017-11-02 DIAGNOSIS — R109 Unspecified abdominal pain: Secondary | ICD-10-CM

## 2017-11-02 NOTE — Telephone Encounter (Signed)
Per [facility] , if you are needing to see past the belly button you need  To order ct of abdomen/plevic w/. Can you please  Change the order.

## 2017-11-02 NOTE — Telephone Encounter (Signed)
I changed the order

## 2017-11-03 ENCOUNTER — Other Ambulatory Visit: Payer: Self-pay | Admitting: Medical

## 2017-11-03 NOTE — Telephone Encounter (Signed)
Called and notified [facility] , they will call the pt to get this set up for the patient.

## 2017-11-08 ENCOUNTER — Ambulatory Visit
Admission: RE | Admit: 2017-11-08 | Discharge: 2017-11-08 | Disposition: A | Discharge status: Home / Self Care | Payer: BLUE CROSS/BLUE SHIELD | Source: Ambulatory Visit | Attending: Medical | Admitting: Medical

## 2017-11-08 DIAGNOSIS — R109 Unspecified abdominal pain: Secondary | ICD-10-CM

## 2017-11-08 MED ORDER — IOPAMIDOL (ISOVUE-300) INJECTION 61%
100.0000 mL | Freq: Once | INTRAVENOUS | Status: AC | PRN
  Administered 2017-11-08: 100 mL via INTRAVENOUS

## 2017-11-09 ENCOUNTER — Encounter: Payer: Self-pay | Admitting: Medical

## 2018-07-11 ENCOUNTER — Encounter: Payer: Self-pay | Admitting: Medical

## 2018-07-11 ENCOUNTER — Ambulatory Visit: Payer: 59 | Admitting: Medical

## 2018-07-11 VITALS — BP 128/80 | HR 85 | Temp 97.5°F | Ht 71.0 in | Wt 187.6 lb

## 2018-07-11 DIAGNOSIS — Z113 Encounter for screening for infections with a predominantly sexual mode of transmission: Secondary | ICD-10-CM

## 2018-07-11 DIAGNOSIS — N419 Inflammatory disease of prostate, unspecified: Secondary | ICD-10-CM

## 2018-07-11 DIAGNOSIS — N50819 Testicular pain, unspecified: Secondary | ICD-10-CM

## 2018-07-11 LAB — POCT URINALYSIS DIP (PROADVANTAGE DEVICE)
BILIRUBIN UA: NEGATIVE mg/dL
Bilirubin, UA: NEGATIVE
Glucose, UA: NEGATIVE mg/dL
Nitrite, UA: NEGATIVE
PROTEIN UA: NEGATIVE mg/dL
RBC UA: NEGATIVE
SPECIFIC GRAVITY, URINE: 1.025
Urobilinogen, Ur: 3.5
pH, UA: 6 (ref 5.0–8.0)

## 2018-07-11 MED ORDER — SULFAMETHOXAZOLE-TRIMETHOPRIM 800-160 MG PO TABS: 1.0000 | ORAL_TABLET | Freq: Two times a day (BID) | ORAL | 0 refills | Status: DC
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
## 2018-07-12 LAB — URINE CULTURE: Organism ID, Bacteria: NO GROWTH

## 2018-07-13 ENCOUNTER — Other Ambulatory Visit: Payer: Self-pay

## 2018-07-13 ENCOUNTER — Ambulatory Visit: Payer: 59 | Admitting: Physician Assistant

## 2018-07-13 ENCOUNTER — Ambulatory Visit (INDEPENDENT_AMBULATORY_CARE_PROVIDER_SITE_OTHER): Payer: 59

## 2018-07-13 ENCOUNTER — Encounter: Payer: Self-pay | Admitting: Physician Assistant

## 2018-07-13 VITALS — BP 106/60 | HR 92 | Temp 99.2°F | Resp 18 | Ht 71.0 in | Wt 190.2 lb

## 2018-07-13 DIAGNOSIS — M79674 Pain in right toe(s): Secondary | ICD-10-CM

## 2018-07-13 DIAGNOSIS — R58 Hemorrhage, not elsewhere classified: Secondary | ICD-10-CM | POA: Diagnosis not present

## 2018-07-13 DIAGNOSIS — S92151A Displaced avulsion fracture (chip fracture) of right talus, initial encounter for closed fracture: Secondary | ICD-10-CM | POA: Diagnosis not present

## 2018-07-13 LAB — GC/CHLAMYDIA PROBE AMP
Chlamydia trachomatis, NAA: NEGATIVE
NEISSERIA GONORRHOEAE BY PCR: NEGATIVE
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
## 2018-07-13 NOTE — Patient Instructions (Signed)
     IF you received an x-ray today, you will receive an invoice from [facility]. Please contact [facility] at [phone] with questions or concerns regarding your invoice.   IF you received labwork today, you will receive an invoice from [facility]. Please contact [facility] at [phone] with questions or concerns regarding your invoice.   Our billing staff will not be able to assist you with questions regarding bills from these companies.  You will be contacted with the lab results as soon as they are available. The fastest way to get your results is to activate your My Chart account. Instructions are located on the last page of this paperwork. If you have not heard from us regarding the results in 2 weeks, please contact this office.     

## 2018-07-16 ENCOUNTER — Encounter: Payer: Self-pay | Admitting: Physician Assistant

## 2018-07-21 ENCOUNTER — Other Ambulatory Visit: Payer: Self-pay | Admitting: Medical

## 2018-07-21 MED ORDER — SULFAMETHOXAZOLE-TRIMETHOPRIM 800-160 MG PO TABS: 1.0000 | ORAL_TABLET | Freq: Two times a day (BID) | ORAL | 0 refills | Status: DC

## 2018-10-24 ENCOUNTER — Ambulatory Visit: Payer: 59 | Admitting: Medical

## 2018-10-24 ENCOUNTER — Encounter: Payer: Self-pay | Admitting: Medical

## 2018-10-24 VITALS — BP 106/78 | HR 79 | Temp 97.8°F | Ht 71.0 in | Wt 196.2 lb

## 2018-10-24 DIAGNOSIS — Z23 Encounter for immunization: Secondary | ICD-10-CM

## 2018-10-24 DIAGNOSIS — H68011 Acute Eustachian salpingitis, right ear: Secondary | ICD-10-CM

## 2018-10-24 DIAGNOSIS — G4700 Insomnia, unspecified: Secondary | ICD-10-CM

## 2018-10-24 DIAGNOSIS — R42 Dizziness and giddiness: Secondary | ICD-10-CM | POA: Diagnosis not present

## 2018-10-24 DIAGNOSIS — L989 Disorder of the skin and subcutaneous tissue, unspecified: Secondary | ICD-10-CM | POA: Diagnosis not present

## 2018-10-24 NOTE — Progress Notes (Signed)
Subjective: Chief Complaint  Patient presents with  . Dizziness    pressure above right ear,congestion   Here for 4 day hx/o dizziness.  Feels a little off balance, but no lightheaded.  No vertigo type sensation.  No chest pain.  No palpitations.  No fever, no sore throat, no ear pain but has pressure in or over right ear, no cough, maybe some sinus pressure.  No fever, no body aches or chills.   Uses benadryl QHS for allergies and sleep.    Was drinking a few mixed drinks some last week, but hasn't drank anything other than water and coffee since 5 days ago.  Denies wheezing or SOB.  No other aggravating or relieving factors. No other complaint.  Has sleep issues in general, uses what he thinks is Benadryl 3 tablets at bedtime.  Has history of sleep apnea that improved with weight loss.  He tries to practice good sleep hygiene  Has a skin lesion of the left cheek he wants looked at  Wants flu shot today  Past Medical History:  Diagnosis Date  . Allergy   . Asthma    allergen induced asthma  . 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 Former smoker    prior occasional smoker  . GAD (generalized anxiety disorder)   . Sleep apnea 3/15   resolved with weight loss, never tolerated CPAP either   Current Outpatient Medications on File Prior to Visit  Medication Sig Dispense Refill  . Ascorbic Acid (VITAMIN C) 1000 MG tablet Take 1,000 mg by mouth daily. Reported on 03/04/2016    . diphenhydrAMINE (BENADRYL) 25 MG tablet Take 25 mg by mouth every 6 (six) hours as needed.    Marland Kitchen FIBER PO Take 1 capsule by mouth daily.    . hydrocortisone (ANUSOL-HC) 2.5 % rectal cream Place 1 application 2 (two) times daily rectally. 30 g 0   No current facility-administered medications on file prior to visit.    ROS as in subjective   Objective: BP 106/78   Pulse 79   Temp 97.8 F (36.6 C) (Oral)   Ht 5\' 11"  (1.803 m)   Wt 196 lb 3.2 oz (89 kg)   SpO2 96%   BMI 27.36 kg/m    Wt Readings from Last 3 Encounters:  10/24/18 196 lb 3.2 oz (89 kg)  07/13/18 190 lb 3.2 oz (86.3 kg)  07/11/18 187 lb 9.6 oz (85.1 kg)   General appearance: alert, no distress, WD/WN,  HEENT: normocephalic, sclerae anicteric, left TM normal, right TM somewhat bulging, decreased light reflex, but no erythema, face nontender, nares patent, no discharge or erythema, pharynx normal Oral cavity: MMM, no lesions Neck: supple, no lymphadenopathy, no thyromegaly, no masses Heart: RRR, normal S1, S2, no murmurs Lungs: CTA bilaterally, no wheezes, rhonchi, or rales Neuro: CN II through XII intact, non focal exam, negative Romberg Pulses: 2+ symmetric, upper and lower extremities, normal cap refill Skin: Flat brownish-pink 6 to 7 mm diameter lesion of left cheek     Assessment: Encounter Diagnoses  Name Primary?  . Acute salpingitis of right eustachian tube Yes  . Dizziness   . Need for influenza vaccination   . Skin lesion   . Insomnia, unspecified type     Plan: discussed symptoms and recommendations as below.     Counseled on the influenza virus vaccine.  Vaccine information sheet given.  Influenza vaccine given after consent obtained.  Patient Instructions  Recommendations:  Begin  Flonase/fluticasone nasal spray 1 spray per nostril twice daily for the next 7 days then can change to once daily if symptoms are not completely gone  You could also add Sudafed over-the-counter/pseudoephedrine 1 tablet at bedtime for the next 5 days or so  Make sure you are drinking plenty of water throughout the day  When you swallow this helps relieve some pressure in the eustachian tubes.   so hydration is good to decrease mucus but also to help open up the eustachian tubes  If not much improved within the next week then call back  Let me know what Sleep-Aid you are using at bedtime.  You are describing diphenhydramine/Benadryl.  The adult dose for this is 25 mg.  However if you are taking 3  of these at bedtime that is too much  If that is the case we will need to find some other sleep aid or treatment recommendation  Call back to give me clarification on the sleep aid.  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

## 2018-10-24 NOTE — Patient Instructions (Signed)
Recommendations:  Begin Flonase/fluticasone nasal spray 1 spray per nostril twice daily for the next 7 days then can change to once daily if symptoms are not completely gone  You could also add Sudafed over-the-counter/pseudoephedrine 1 tablet at bedtime for the next 5 days or so  Make sure you are drinking plenty of water throughout the day  When you swallow this helps relieve some pressure in the eustachian tubes.   so hydration is good to decrease mucus but also to help open up the eustachian tubes  If not much improved within the next week then call back  Let me know what Sleep-Aid you are using at bedtime.  You are describing diphenhydramine/Benadryl.  The adult dose for this is 25 mg.  However if you are taking 3 of these at bedtime that is too much  If that is the case we will need to find some other sleep aid or treatment recommendation

## 2018-10-24 NOTE — Addendum Note (Signed)
Addended by: Edgar Frisk on: 10/24/2018 05:19 PM   Modules accepted: Orders

## 2018-11-17 ENCOUNTER — Ambulatory Visit: Payer: 59 | Admitting: Medical

## 2018-11-17 ENCOUNTER — Encounter: Payer: Self-pay | Admitting: Medical

## 2018-11-17 VITALS — BP 120/70 | HR 70 | Temp 97.9°F | Resp 16 | Ht 71.0 in | Wt 193.2 lb

## 2018-11-17 DIAGNOSIS — Z0000 Encounter for general adult medical examination without abnormal findings: Secondary | ICD-10-CM

## 2018-11-17 DIAGNOSIS — G4700 Insomnia, unspecified: Secondary | ICD-10-CM | POA: Diagnosis not present

## 2018-11-17 DIAGNOSIS — Z113 Encounter for screening for infections with a predominantly sexual mode of transmission: Secondary | ICD-10-CM

## 2018-11-17 DIAGNOSIS — L989 Disorder of the skin and subcutaneous tissue, unspecified: Secondary | ICD-10-CM

## 2018-11-17 LAB — POCT URINALYSIS DIP (PROADVANTAGE DEVICE)
BILIRUBIN UA: NEGATIVE
GLUCOSE UA: NEGATIVE mg/dL
Ketones, POC UA: NEGATIVE mg/dL
Leukocytes, UA: NEGATIVE
NITRITE UA: NEGATIVE
PH UA: 6 (ref 5.0–8.0)
Protein Ur, POC: NEGATIVE mg/dL
RBC UA: NEGATIVE
SPECIFIC GRAVITY, URINE: 1.02
UUROB: NEGATIVE

## 2018-11-17 MED ORDER — MELATONIN 10 MG PO TABS: 1.0000 | ORAL_TABLET | Freq: Every day | ORAL | 0 refills | Status: DC

## 2018-11-17 NOTE — Progress Notes (Signed)
Subjective:   HPI  Gary Garrett is a 47 y.o. male who presents for physical Chief Complaint  Patient presents with  . CPE    fasting CPE   vision 2 weeks ago    Medical care team includes: Tysinger, Camelia Eng, PA-C here for primary care Dentist Eye doctor  Concerns: Insomnia - has cut down from 3 benadryl QHS to 2 benadryl QHS.  Reviewed their medical, surgical, family, social, medication, and allergy history and updated chart as appropriate.  Past Medical History:  Diagnosis Date  . Allergy   . Asthma    allergen induced asthma  . 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 Former smoker    prior occasional smoker  . GAD (generalized anxiety disorder)   . Sleep apnea 3/15   resolved with weight loss, never tolerated CPAP either    Past Surgical History:  Procedure Laterality Date  . SKIN LESION EXCISION    . WISDOM TOOTH EXTRACTION      Social History   Socioeconomic History  . Marital status: Single    Spouse name: Not on file  . Number of children: Not on file  . Years of education: Not on file  . Highest education level: Not on file  Occupational History  . Occupation: Neurosurgeon: [employer]  Social Needs  . Financial resource strain: Not on file  . Food insecurity:    Worry: Not on file    Inability: Not on file  . Transportation needs:    Medical: Not on file    Non-medical: Not on file  Tobacco Use  . Smoking status: Former Smoker    Packs/day: 0.25    Last attempt to quit: 03/29/2011    Years since quitting: 7.6  . Smokeless tobacco: Never Used  Substance and Sexual Activity  . Alcohol use: Yes    Alcohol/week: 0.0 standard drinks    Comment: occasionally  . Drug use: No  . Sexual activity: Not on file  Lifestyle  . Physical activity:    Days per week: Not on file    Minutes per session: Not on file  . Stress: Not on file  Relationships  . Social connections:    Talks on phone: Not on  file    Gets together: Not on file    Attends religious service: Not on file    Active member of club or organization: Not on file    Attends meetings of clubs or organizations: Not on file    Relationship status: Not on file  . Intimate partner violence:    Fear of current or ex partner: Not on file    Emotionally abused: Not on file    Physically abused: Not on file    Forced sexual activity: Not on file  Other Topics Concern  . Not on file  Social History Narrative   Lives at home by himself, single.  Exercise - stretching, walking, running, bike, weights.  Mult partners, MSM, condom use.  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 Asthma Sister   . Fibromyalgia Sister   . Cancer Mother        breast cancer  . Cancer Maternal Grandfather 90       prostate  . Hypertension Paternal Grandmother   . Heart disease Paternal Grandfather        died  in 56s of heart disease  . Cancer Other        great grandmother lung cancer  . Stroke Maternal Grandmother   . Diabetes Neg Hx      Current Outpatient Medications:  .  diphenhydrAMINE (BENADRYL) 25 MG tablet, Take 25 mg by mouth every 6 (six) hours as needed., Disp: , Rfl:  .  FIBER PO, Take 1 capsule by mouth daily., Disp: , Rfl:  .  Ascorbic Acid (VITAMIN C) 1000 MG tablet, Take 1,000 mg by mouth daily. Reported on 03/04/2016, Disp: , Rfl:  .  hydrocortisone (ANUSOL-HC) 2.5 % rectal cream, Place 1 application 2 (two) times daily rectally. (Patient not taking: Reported on 11/17/2018), Disp: 30 g, Rfl: 0 .  Melatonin 10 MG TABS, Take 1 tablet by mouth at bedtime., Disp: 30 tablet, Rfl: 0  No Known Allergies   Review of Systems Constitutional: -fever, -chills, -sweats, -unexpected weight change, -decreased appetite, -fatigue Allergy: -sneezing, -itching, -congestion Dermatology: -changing moles, --rash, -lumps ENT: -runny nose, -ear pain, -sore throat, -hoarseness, -sinus  pain, -teeth pain, - ringing in ears, -hearing loss, -nosebleeds Cardiology: -chest pain, -palpitations, -swelling, -difficulty breathing when lying flat, -waking up short of breath Respiratory: -cough, -shortness of breath, -difficulty breathing with exercise or exertion, -wheezing, -coughing up blood Gastroenterology: -abdominal pain, -nausea, -vomiting, -diarrhea, -constipation, -blood in stool, -changes in bowel movement, -difficulty swallowing or eating Hematology: -bleeding, -bruising  Musculoskeletal: -joint aches, -muscle aches, -joint swelling, -back pain, -neck pain, -cramping, -changes in gait Ophthalmology: denies vision changes, eye redness, itching, discharge Urology: -burning with urination, -difficulty urinating, -blood in urine, -urinary frequency, -urgency, -incontinence Neurology: -headache, -weakness, -tingling, -numbness, -memory loss, -falls, -dizziness Psychology: -depressed mood, -agitation, -sleep problems     Objective:   BP 120/70   Pulse 70   Temp 97.9 F (36.6 C) (Oral)   Resp 16   Ht 5\' 11"  (1.803 m)   Wt 193 lb 3.2 oz (87.6 kg)   SpO2 97%   BMI 26.95 kg/m   General appearance: alert, no distress, WD/WN, Caucasian male Skin: 2cm raised benign sebaceous cyst of right shoulder posteriorly, scattered macules, birth mark of left upper arm, unchanged raised 42mm diameter lesion of right medial eyebrow region HEENT: normocephalic, conjunctiva/corneas normal, sclerae anicteric, PERRLA, EOMi, nares patent, no discharge or erythema, pharynx normal Oral cavity: MMM, tongue normal, teeth in good repair Neck: supple, no lymphadenopathy, no thyromegaly, no masses, normal ROM, no bruits Chest: non tender, normal shape and expansion Heart: RRR, normal S1, S2, no murmurs Lungs: CTA bilaterally, no wheezes, rhonchi, or rales Abdomen: +bs, soft, non tender, non distended, no masses, no hepatomegaly, no splenomegaly, no bruits Back: non tender, normal ROM, no  scoliosis Musculoskeletal: upper extremities non tender, no obvious deformity, normal ROM throughout, lower extremities non tender, no obvious deformity, normal ROM throughout Extremities: no edema, no cyanosis, no clubbing Pulses: 2+ symmetric, upper and lower extremities, normal cap refill Neurological: alert, oriented x 3, CN2-12 intact, strength normal upper extremities and lower extremities, sensation normal throughout, DTRs 2+ throughout, no cerebellar signs, gait normal Psychiatric: normal affect, behavior normal, pleasant  GU: normal male external genitalia, circumcised, nontender, no masses, no hernia, no lymphadenopathy Rectal: deferred  Assessment and Plan :    Encounter Diagnoses  Name Primary?  . Routine general medical examination at a health care facility Yes  . Skin lesion   . Insomnia, unspecified type   . Screen for STD (sexually transmitted disease)     Physical exam -  discussed and counseled on healthy lifestyle, diet, exercise, preventative care, vaccinations, sick and well care, proper use of emergency dept and after hours care, and addressed their concerns.    Health screening: See your eye doctor yearly for routine vision care. See your dentist yearly for routine dental care including hygiene visits twice yearly.  Discussed STD testing, discussed prevention, condom use, means of transmission  Cancer screening Discussed colonoscopy screening age 12yo unless higher risk for earlier screening Discussed PSA, prostate exam, and prostate cancer screening risks/benefits.   Discussed prostate symptoms as well.  Prostate screening performed: no  Vaccinations: Up to date  Other issues: Insomnia -counseled on sleep hygiene, begin trial of melatonin below can start either half a tablet or 1 tablet nightly, stop Benadryl.  Discussed condom use, safe sex  Hx/o OSA, but resolved with weight loss  Tamotsu was seen today for cpe.  Diagnoses and all orders for this  visit:  Routine general medical examination at a health care facility -     POCT Urinalysis DIP (Proadvantage Device) -     HIV Antibody (routine testing w rflx) -     RPR -     GC/Chlamydia Probe Amp -     Comprehensive metabolic panel -     CBC  Skin lesion  Insomnia, unspecified type  Screen for STD (sexually transmitted disease) -     HIV Antibody (routine testing w rflx) -     RPR -     GC/Chlamydia Probe Amp  Other orders -     Melatonin 10 MG TABS; Take 1 tablet by mouth at bedtime.   Follow-up pending labs, yearly for physical

## 2018-11-18 LAB — CBC
HEMOGLOBIN: 15.7 g/dL (ref 13.0–17.7)
Hematocrit: 45.8 % (ref 37.5–51.0)
MCH: 29.8 pg (ref 26.6–33.0)
MCHC: 34.3 g/dL (ref 31.5–35.7)
MCV: 87 fL (ref 79–97)
Platelets: 285 10*3/uL (ref 150–450)
RBC: 5.27 x10E6/uL (ref 4.14–5.80)
RDW: 12.3 % (ref 12.3–15.4)
WBC: 4.1 10*3/uL (ref 3.4–10.8)

## 2018-11-18 LAB — COMPREHENSIVE METABOLIC PANEL
A/G RATIO: 2.4 — AB (ref 1.2–2.2)
ALT: 44 IU/L (ref 0–44)
AST: 21 IU/L (ref 0–40)
Albumin: 5.2 g/dL (ref 3.5–5.5)
Alkaline Phosphatase: 43 IU/L (ref 39–117)
BUN/Creatinine Ratio: 21 — ABNORMAL HIGH (ref 9–20)
BUN: 19 mg/dL (ref 6–24)
Bilirubin Total: 0.3 mg/dL (ref 0.0–1.2)
CALCIUM: 9.8 mg/dL (ref 8.7–10.2)
CO2: 23 mmol/L (ref 20–29)
Chloride: 102 mmol/L (ref 96–106)
Creatinine, Ser: 0.92 mg/dL (ref 0.76–1.27)
GFR calc Af Amer: 114 mL/min/{1.73_m2} (ref >59)
GFR calc non Af Amer: 99 mL/min/{1.73_m2} (ref >59)
GLOBULIN, TOTAL: 2.2 g/dL (ref 1.5–4.5)
Glucose: 94 mg/dL (ref 65–99)
POTASSIUM: 4.4 mmol/L (ref 3.5–5.2)
Sodium: 139 mmol/L (ref 134–144)
TOTAL PROTEIN: 7.4 g/dL (ref 6.0–8.5)

## 2018-11-18 LAB — RPR: RPR: NONREACTIVE

## 2018-11-18 LAB — HIV ANTIBODY (ROUTINE TESTING W REFLEX): HIV SCREEN 4TH GENERATION: NONREACTIVE

## 2018-11-19 LAB — GC/CHLAMYDIA PROBE AMP
CHLAMYDIA, DNA PROBE: NEGATIVE
NEISSERIA GONORRHOEAE BY PCR: NEGATIVE

## 2018-11-21 DIAGNOSIS — L720 Epidermal cyst: Secondary | ICD-10-CM | POA: Diagnosis not present

## 2018-11-21 DIAGNOSIS — L570 Actinic keratosis: Secondary | ICD-10-CM | POA: Diagnosis not present

## 2018-11-21 DIAGNOSIS — D225 Melanocytic nevi of trunk: Secondary | ICD-10-CM | POA: Diagnosis not present

## 2018-12-14 DIAGNOSIS — J019 Acute sinusitis, unspecified: Secondary | ICD-10-CM | POA: Diagnosis not present

## 2018-12-16 ENCOUNTER — Ambulatory Visit: Payer: 59 | Admitting: Medical

## 2018-12-16 ENCOUNTER — Encounter: Payer: Self-pay | Admitting: Medical

## 2018-12-16 VITALS — BP 126/74 | HR 94 | Temp 98.1°F | Resp 16 | Ht 72.0 in | Wt 196.0 lb

## 2018-12-16 DIAGNOSIS — H6690 Otitis media, unspecified, unspecified ear: Secondary | ICD-10-CM | POA: Diagnosis not present

## 2018-12-16 DIAGNOSIS — J3489 Other specified disorders of nose and nasal sinuses: Secondary | ICD-10-CM

## 2018-12-16 NOTE — Progress Notes (Signed)
Subjective:  Gary Garrett is a 48 y.o. male who presents for  Chief Complaint  Patient presents with  . sinus    sinus congestion, ear ache, drainage X 12-06-18    Here for week history of head congestion, gradually moved to the left ear, ear pressure, fatigue, general malaise, mild sore throat, postnasal drainage, some cough.  He went to care now urgent care in [facility] while he was out of town after the flight caused some additional discomfort in his head pressure.  He was prescribed cefdinir 3 mg twice daily, loratadine.  He is also using Flonase.  He does want to come and get this rechecked.  No worse, no better.  He missed his dose this morning.  No other aggravating or relieving factors.  No other complaint.    Past Medical History:  Diagnosis Date  . Allergy   . Asthma    allergen induced asthma  . 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 Former smoker    prior occasional smoker  . GAD (generalized anxiety disorder)   . Sleep apnea 3/15   resolved with weight loss, never tolerated CPAP either    Current Outpatient Medications on File Prior to Visit  Medication Sig Dispense Refill  . cefdinir (OMNICEF) 300 MG capsule Take 300 mg by mouth 2 (two) times daily.    . diphenhydrAMINE (BENADRYL) 25 MG tablet Take 25 mg by mouth every 6 (six) hours as needed.    Marland Kitchen FIBER PO Take 1 capsule by mouth daily.    . fluticasone (FLONASE) 50 MCG/ACT nasal spray Place 1 spray into both nostrils daily.    Marland Kitchen loratadine (CLARITIN) 10 MG tablet Take 10 mg by mouth daily.    . Melatonin 10 MG TABS Take 1 tablet by mouth at bedtime. 30 tablet 0  . Ascorbic Acid (VITAMIN C) 1000 MG tablet Take 1,000 mg by mouth daily. Reported on 03/04/2016    . hydrocortisone (ANUSOL-HC) 2.5 % rectal cream Place 1 application 2 (two) times daily rectally. (Patient not taking: Reported on 11/17/2018) 30 g 0   No current facility-administered medications on file prior to visit.     ROS as  in subjective   Objective: BP 126/74   Pulse 94   Temp 98.1 F (36.7 C) (Oral)   Resp 16   Ht 6' (1.829 m)   Wt 196 lb (88.9 kg)   SpO2 98%   BMI 26.58 kg/m   General appearance: Alert, well developed, well nourished, no distress                             Skin: warm, no rash                           Head: no sinus tenderness,                            Eyes: conjunctiva pink, corneas clear                            Ears: flat left tympanic membrane, mild erythema, flat right tympanic membrane, external ear canals normal  Nose: septum midline, turbinates swollen, with erythema and clear discharge             Mouth/throat: MMM, tongue normal, mild pharyngeal erythema                           Neck: supple, no adenopathy, no thyromegaly, non tender                         Lungs: clear, no wheezes, no rales, no rhonchi        Assessment  Encounter Diagnoses  Name Primary?  . Acute otitis media, unspecified otitis media type Yes  . Sinus pressure       Plan: Discussed diagnosis.   Discussed usual time frame to see improvement. Discussed possible complications or symptoms that would prompt call back or recheck within the next few days.     Recommendations  Continue the Cefdinir 300mg  twice daily antibiotic, and make sure you finish this  Stop loratadine and change to Mucinex or Sudafe for 4-5 days  Rest, hydrate well throughout the day  Swallowing can release pressure in the ears  Continue Flonase another week  You can use some Ibpuprofen over the counter as well   Patient was advised to call or return if worse or not improving in the next few days.    Patient voiced understanding of diagnosis, recommendations, and treatment plan.

## 2018-12-16 NOTE — Patient Instructions (Signed)
Recommendations  Continue the Cefdinir 300mg  twice daily antibiotic, and make sure you finish this  Stop loratadine and change to Mucinex or Sudafe for 4-5 days  Rest, hydrate well throughout the day  Swallowing can release pressure in the ears  Continue Flonase another week  You can use some Ibpuprofen over the counter as well

## 2018-12-19 ENCOUNTER — Telehealth: Payer: Self-pay

## 2018-12-19 ENCOUNTER — Other Ambulatory Visit: Payer: Self-pay | Admitting: Medical

## 2018-12-19 MED ORDER — AZITHROMYCIN 250 MG PO TABS: ORAL_TABLET | ORAL | 0 refills | Status: DC

## 2018-12-19 NOTE — Telephone Encounter (Signed)
I sent zpak, different round of antibiotic;  Lets see if this helps.

## 2018-12-19 NOTE — Telephone Encounter (Signed)
Patient has been notified

## 2018-12-19 NOTE — Telephone Encounter (Signed)
Patient called to inform his pcp that her is not feeling any better in fact he feels a bit worse. He also sent this message on Mychart and is awaiting a response. Please advise.

## 2019-05-09 ENCOUNTER — Ambulatory Visit: Payer: 59 | Admitting: Medical

## 2019-05-09 ENCOUNTER — Encounter: Payer: Self-pay | Admitting: Medical

## 2019-05-09 ENCOUNTER — Other Ambulatory Visit: Payer: Self-pay

## 2019-05-09 VITALS — Temp 97.9°F | Ht 72.0 in | Wt 192.0 lb

## 2019-05-09 DIAGNOSIS — L237 Allergic contact dermatitis due to plants, except food: Secondary | ICD-10-CM

## 2019-05-09 MED ORDER — PREDNISONE 10 MG PO TABS: ORAL_TABLET | ORAL | 0 refills | Status: DC

## 2019-05-09 MED ORDER — TRIAMCINOLONE ACETONIDE 0.1 % EX CREA: 1.0000 "application " | TOPICAL_CREAM | Freq: Two times a day (BID) | CUTANEOUS | 0 refills | Status: DC

## 2019-05-09 NOTE — Progress Notes (Signed)
Subjective:   Gary Garrett is a 48 y.o. male who presents for evaluation of a rash involving the arms and legs.   This visit type was conducted due to national recommendations for restrictions regarding the COVID-19 Pandemic (e.g. social distancing) in an effort to limit this patient's exposure and mitigate transmission in our community.  This format is felt to be most appropriate for this patient at this time.    Documentation for virtual audio and video telecommunications through Zoom encounter:  The patient was located at home. The provider was located in the office. The patient did consent to this visit and is aware of possible charges through their insurance for this visit.  The other persons participating in this telemedicine service were none. Time spent on call was 15 minutes and in review of previous records >15 minutes total.  This virtual service is not related to other E/M service within previous 7 days.   Concerned for poison ivy as they have recent exposure - working out in [place] this weekend.   Rash started 2 days  ago.  Rash is itchy, red, raised.  Patient denies: fever, joint aches, chills, nausea. Patient has not had contacts with similar rash.   Using oral benadryl and topical hydrocortisone cream for the rash.  No other aggravating or relieving factors.  No other c/o.   The following portions of the patient's history were reviewed and updated as appropriate: allergies, current medications, past family history, past medical history, past social history and problem list.  Review of Systems As in subjective above   Objective:    Gen: wd, wn, nad Skin:  Several scattered pink/red, linear and round urticarial lesions involving bilat foreams, bilat shins   Assessment:   Encounter Diagnosis  Name Primary?  . Poison ivy dermatitis Yes      Plan:   Discussed symptoms and exam findings, diagnosis, treatment options.  Etiology appears to be poison ivy  dermatitis.  Prescribed: triamcinolone steroid cream to apply topically daily for the next several days until this resolves.  Begin oral prednisone as below.   This regimen has worked well in the past for him.  Discussed washing contaminated clothing on the hot cycle in the washing machine, washing gloves, shoes, or other utensils in hot soapy water.  Avoid re- exposure.  Discussed signs of infection or worsening symptoms that would prompt recheck.   Advised OTC Benadryl 1/2-1 tablet po up to BID.    Gary Garrett was seen today for poison oak.  Diagnoses and all orders for this visit:  Poison ivy dermatitis  Other orders -     predniSONE (DELTASONE) 10 MG tablet; 6/5/4/3/2/1 -     triamcinolone cream (KENALOG) 0.1 %; Apply 1 application topically 2 (two) times daily.     Follow up prn, or if not much improved in the next 4-5 days.

## 2019-05-26 ENCOUNTER — Other Ambulatory Visit: Payer: Self-pay

## 2019-05-26 ENCOUNTER — Ambulatory Visit: Payer: 59 | Admitting: Medical

## 2019-05-26 ENCOUNTER — Ambulatory Visit
Admission: RE | Admit: 2019-05-26 | Discharge: 2019-05-26 | Disposition: A | Discharge status: Home / Self Care | Payer: BLUE CROSS/BLUE SHIELD | Source: Ambulatory Visit | Attending: Medical | Admitting: Medical

## 2019-05-26 ENCOUNTER — Encounter: Payer: Self-pay | Admitting: Medical

## 2019-05-26 VITALS — BP 120/82 | HR 74 | Temp 97.7°F | Ht 72.0 in | Wt 197.6 lb

## 2019-05-26 DIAGNOSIS — R109 Unspecified abdominal pain: Secondary | ICD-10-CM

## 2019-05-26 LAB — POCT URINALYSIS DIP (PROADVANTAGE DEVICE)
Bilirubin, UA: NEGATIVE
Blood, UA: NEGATIVE
Glucose, UA: NEGATIVE mg/dL
Nitrite, UA: NEGATIVE
Protein Ur, POC: NEGATIVE mg/dL
Specific Gravity, Urine: 1.015
Urobilinogen, Ur: NEGATIVE
pH, UA: 6.5 (ref 5.0–8.0)

## 2019-05-26 NOTE — Progress Notes (Signed)
Subjective:  Gary Garrett is a 48 y.o. male who presents for Chief Complaint  Patient presents with  . Abdominal Pain    right side x 1-2 weeks      Here for 2-week history of dull vague right-sided abdominal pain.  The pain is fairly steady but dull mild.  Symptom does not feel quite right.  Denies back pain, no urinary frequency no blood in the urine, no cloudy urine, no change in urination, no diarrhea, no constipation, no recent change in bowels, no blood in the stool, no fever, no body aches or chills, no debris in the urine, no history of passing a kidney stone recently.  No recent strenuous activity that would cause belly or back pain he thinks.  He has been doing a lot of work around the house in general out in the yard.  But denies anything too strenuous. No other aggravating or relieving factors.    No other c/o.  The following portions of the patient's history were reviewed and updated as appropriate: allergies, current medications, past family history, past medical history, past social history, past surgical history and problem list.  ROS Otherwise as in subjective above   Past Medical History:  Diagnosis Date  . Allergy   . Asthma    allergen induced asthma  . 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 Former smoker    prior occasional smoker  . GAD (generalized anxiety disorder)   . Sleep apnea 3/15   resolved with weight loss, never tolerated CPAP either   Current Outpatient Medications on File Prior to Visit  Medication Sig Dispense Refill  . FIBER PO Take 1 capsule by mouth daily.    . fluticasone (FLONASE) 50 MCG/ACT nasal spray Place 1 spray into both nostrils daily.    . hydrocortisone (ANUSOL-HC) 2.5 % rectal cream Place 1 application 2 (two) times daily rectally. 30 g 0  . triamcinolone cream (KENALOG) 0.1 % Apply 1 application topically 2 (two) times daily. 45 g 0  . Ascorbic Acid (VITAMIN C) 1000 MG tablet Take 1,000 mg by mouth daily.  Reported on 03/04/2016    . diphenhydrAMINE (BENADRYL) 25 MG tablet Take 25 mg by mouth every 6 (six) hours as needed.    . loratadine (CLARITIN) 10 MG tablet Take 10 mg by mouth daily.    . Melatonin 10 MG TABS Take 1 tablet by mouth at bedtime. (Patient not taking: Reported on 05/26/2019) 30 tablet 0  . predniSONE (DELTASONE) 10 MG tablet 6/5/4/3/2/1 (Patient not taking: Reported on 05/26/2019) 21 tablet 0   No current facility-administered medications on file prior to visit.    Past Surgical History:  Procedure Laterality Date  . SKIN LESION EXCISION    . WISDOM TOOTH EXTRACTION       Objective: BP 120/82   Pulse 74   Temp 97.7 F (36.5 C) (Oral)   Ht 6' (1.829 m)   Wt 197 lb 9.6 oz (89.6 kg)   SpO2 97%   BMI 26.80 kg/m    Wt Readings from Last 3 Encounters:  05/26/19 197 lb 9.6 oz (89.6 kg)  05/09/19 192 lb (87.1 kg)  12/16/18 196 lb (88.9 kg)   General appearance: alert, no distress, well developed, well nourished Lungs: CTA bilaterally, no wheezes, rhonchi, or rales Abdomen: +bs, soft, mild generalized tenderness on the right abdomen, no rebound, otherwise non tender, non distended, no masses, no hepatomegaly, no splenomegaly Back nontender, no pain  with range of motion which is full Pulses: 2+ radial pulses, 2+ pedal pulses, normal cap refill Ext: no edema   Assessment: Encounter Diagnoses  Name Primary?  . Abdominal pain, unspecified abdominal location Yes  . Flank pain      Plan: We discussed symptoms and concerns which are mild and vague.  Looking back in the chart record he had a CT scan in November 2018 of the abdomen and pelvis for similar dull pain at that time was found to have bilateral nonobstructive stones in the kidneys, mildly enlarged prostate, small fat-containing bilateral inguinal hernias but no other abnormalities.  We discussed possible causes.  Reviewed urinalysis.  Crystals on urine micro, otherwise normal  Will send for KUB  Jaycion was  seen today for abdominal pain.  Diagnoses and all orders for this visit:  Abdominal pain, unspecified abdominal location -     POCT Urinalysis DIP (Proadvantage Device) -     DG Abd 1 View; Future  Flank pain -     DG Abd 1 View; Future    Follow up: pending xray

## 2019-05-27 ENCOUNTER — Other Ambulatory Visit: Payer: Self-pay | Admitting: Medical

## 2019-05-27 MED ORDER — ONDANSETRON HCL 4 MG PO TABS: 4.0000 mg | ORAL_TABLET | Freq: Every day | ORAL | 0 refills | Status: AC | PRN

## 2019-05-27 MED ORDER — HYDROCODONE-ACETAMINOPHEN 7.5-325 MG PO TABS: 1.0000 | ORAL_TABLET | Freq: Four times a day (QID) | ORAL | 0 refills | Status: DC | PRN

## 2019-05-27 NOTE — Progress Notes (Signed)
zofra

## 2019-05-31 ENCOUNTER — Other Ambulatory Visit: Payer: Self-pay | Admitting: Medical

## 2019-05-31 ENCOUNTER — Telehealth: Payer: Self-pay | Admitting: Medical

## 2019-05-31 DIAGNOSIS — R109 Unspecified abdominal pain: Secondary | ICD-10-CM

## 2019-05-31 NOTE — Telephone Encounter (Signed)
Please call patient to screen.  I want him to come in today or tomorrow for labs since he has ongoing pains in right abdomen.

## 2019-05-31 NOTE — Telephone Encounter (Signed)
Called and made pt appt for 06/01/19

## 2019-06-01 ENCOUNTER — Other Ambulatory Visit: Payer: Self-pay

## 2019-06-01 ENCOUNTER — Other Ambulatory Visit: Payer: 59

## 2019-06-01 DIAGNOSIS — R109 Unspecified abdominal pain: Secondary | ICD-10-CM

## 2019-06-02 ENCOUNTER — Other Ambulatory Visit: Payer: Self-pay | Admitting: Medical

## 2019-06-02 LAB — CBC WITH DIFFERENTIAL/PLATELET
Basophils Absolute: 0 10*3/uL (ref 0.0–0.2)
Basos: 1 %
EOS (ABSOLUTE): 0.1 10*3/uL (ref 0.0–0.4)
Eos: 4 %
Hematocrit: 43.3 % (ref 37.5–51.0)
Hemoglobin: 14.8 g/dL (ref 13.0–17.7)
Immature Grans (Abs): 0 10*3/uL (ref 0.0–0.1)
Immature Granulocytes: 0 %
Lymphocytes Absolute: 1.6 10*3/uL (ref 0.7–3.1)
Lymphs: 47 %
MCH: 29.7 pg (ref 26.6–33.0)
MCHC: 34.2 g/dL (ref 31.5–35.7)
MCV: 87 fL (ref 79–97)
Monocytes Absolute: 0.6 10*3/uL (ref 0.1–0.9)
Monocytes: 16 %
Neutrophils Absolute: 1.1 10*3/uL — ABNORMAL LOW (ref 1.4–7.0)
Neutrophils: 32 %
Platelets: 243 10*3/uL (ref 150–450)
RBC: 4.99 x10E6/uL (ref 4.14–5.80)
RDW: 13.1 % (ref 11.6–15.4)
WBC: 3.5 10*3/uL (ref 3.4–10.8)

## 2019-06-02 LAB — COMPREHENSIVE METABOLIC PANEL
ALT: 28 IU/L (ref 0–44)
AST: 19 IU/L (ref 0–40)
Albumin/Globulin Ratio: 2 (ref 1.2–2.2)
Albumin: 4.5 g/dL (ref 4.0–5.0)
Alkaline Phosphatase: 43 IU/L (ref 39–117)
BUN/Creatinine Ratio: 17 (ref 9–20)
BUN: 16 mg/dL (ref 6–24)
Bilirubin Total: <0.2 mg/dL (ref 0.0–1.2)
CO2: 24 mmol/L (ref 20–29)
Calcium: 8.9 mg/dL (ref 8.7–10.2)
Chloride: 102 mmol/L (ref 96–106)
Creatinine, Ser: 0.93 mg/dL (ref 0.76–1.27)
GFR calc Af Amer: 112 mL/min/{1.73_m2} (ref >59)
GFR calc non Af Amer: 97 mL/min/{1.73_m2} (ref >59)
Globulin, Total: 2.2 g/dL (ref 1.5–4.5)
Glucose: 96 mg/dL (ref 65–99)
Potassium: 4.5 mmol/L (ref 3.5–5.2)
Sodium: 140 mmol/L (ref 134–144)
Total Protein: 6.7 g/dL (ref 6.0–8.5)

## 2019-06-02 LAB — PSA: Prostate Specific Ag, Serum: 0.7 ng/mL (ref 0.0–4.0)

## 2019-06-02 MED ORDER — TAMSULOSIN HCL 0.4 MG PO CAPS: 0.4000 mg | ORAL_CAPSULE | Freq: Every day | ORAL | 0 refills | Status: DC

## 2019-06-07 ENCOUNTER — Other Ambulatory Visit: Payer: Self-pay

## 2019-06-07 ENCOUNTER — Ambulatory Visit: Payer: 59 | Admitting: Family Medicine

## 2019-06-07 ENCOUNTER — Encounter: Payer: Self-pay | Admitting: Family Medicine

## 2019-06-07 VITALS — BP 120/70 | HR 72 | Temp 98.6°F | Wt 194.2 lb

## 2019-06-07 DIAGNOSIS — L237 Allergic contact dermatitis due to plants, except food: Secondary | ICD-10-CM | POA: Diagnosis not present

## 2019-06-07 MED ORDER — PREDNISONE 10 MG (21) PO TBPK: ORAL_TABLET | Freq: Every day | ORAL | 0 refills | Status: DC

## 2019-06-07 NOTE — Patient Instructions (Signed)
Poison Ivy Dermatitis  Poison ivy dermatitis is inflammation of the skin that is caused by the allergens on the leaves of the poison ivy plant. The skin reaction often involves redness, swelling, blisters, and extreme itching. What are the causes? This condition is caused by a specific chemical (urushiol) found in the sap of the poison ivy plant. This chemical is sticky and can be easily spread to people, animals, and objects. You can get poison ivy dermatitis by:  Having direct contact with a poison ivy plant.  Touching animals, other people, or objects that have come in contact with poison ivy and have the chemical on them. What increases the risk? This condition is more likely to develop in:  People who are outdoors often.  People who go outdoors without wearing protective clothing, such as closed shoes, long pants, and a long-sleeved shirt. What are the signs or symptoms? Symptoms of this condition include:  Redness and itching.  A rash that often includes bumps and blisters. The rash usually appears 48 hours after exposure.  Swelling. This may occur if the reaction is more severe. Symptoms usually last for 1-2 weeks. However, the first time you develop this condition, symptoms may last 3-4 weeks. How is this diagnosed? This condition may be diagnosed based on your symptoms and a physical exam. Your health care provider may also ask you about any recent outdoor activity. How is this treated? Treatment for this condition will vary depending on how severe it is. Treatment may include:  Hydrocortisone creams or calamine lotions to relieve itching.  Oatmeal baths to soothe the skin.  Over-the-counter antihistamine tablets.  Oral steroid medicine for more severe outbreaks. Follow these instructions at home:  Take or apply over-the-counter and prescription medicines only as told by your health care provider.  Wash exposed skin as soon as possible with soap and cold water.  Use  hydrocortisone creams or calamine lotion as needed to soothe the skin and relieve itching.  Take oatmeal baths as needed. Use colloidal oatmeal. You can get this at your local pharmacy or grocery store. Follow the instructions on the packaging.  Do not scratch or rub your skin.  While you have the rash, wash clothes right after you wear them. How is this prevented?   Learn to identify the poison ivy plant and avoid contact with the plant. This plant can be recognized by the number of leaves. Generally, poison ivy has three leaves with flowering branches on a single stem. The leaves are typically glossy, and they have jagged edges that come to a point at the front.  If you have been exposed to poison ivy, thoroughly wash with soap and water right away. You have about 30 minutes to remove the plant resin before it will cause the rash. Be sure to wash under your fingernails because any plant resin there will continue to spread the rash.  When hiking or camping, wear clothes that will help you to avoid exposure on the skin. This includes long pants, a long-sleeved shirt, tall socks, and hiking boots. You can also apply preventive lotion to your skin to help limit exposure.  If you suspect that your clothes or outdoor gear came in contact with poison ivy, rinse them off outside with a garden hose before you bring them inside your house. Contact a health care provider if:  You have open sores in the rash area.  You have more redness, swelling, or pain in the affected area.  You have redness that   spreads beyond the rash area.  You have fluid, blood, or pus coming from the affected area.  You have a fever.  You have a rash over a large area of your body.  You have a rash on your eyes, mouth, or genitals.  Your rash does not improve after a few days. Get help right away if:  Your face swells or your eyes swell shut.  You have trouble breathing.  You have trouble swallowing. This  information is not intended to replace advice given to you by your health care provider. Make sure you discuss any questions you have with your health care provider. Document Released: 11/27/2000 Document Revised: 05/13/2017 Document Reviewed: 05/08/2015 Elsevier Interactive Patient Education  2019 Elsevier Inc.  

## 2019-06-07 NOTE — Progress Notes (Signed)
   Subjective:    Patient ID: Gary Garrett, male    DOB: 03-Jun-1971, 48 y.o.   MRN: [id number]  HPI Chief Complaint  Patient presents with  . poison oak    poison oak on legs. seen a couple weeks ago for this and was given prednisone.    Here with complaints of a pruritic rash after being exposed to poison ivy last weekend. States he has several areas on his legs. Reports history of allergies to poison ivy and he had to be treated with steroids for a reaction last month. States symptoms resolved but now he has a new reaction.  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 Wears long pants and boots. States he is very sensitive to poison ivy.   Denies fever, chills, cough, shortness of breath, N/V.  He has been using topical Kenalog. States the rash is spreading and now on the top of his feet.   Seen by his PCP virtually on 05/09/19 for poison ivy rash. Prescribed prednisone dose pak and topical Kenalog.    Review of Systems Pertinent positives and negatives in the history of present illness.     Objective:   Physical Exam BP 120/70   Pulse 72   Temp 98.6 F (37 C) (Oral)   Wt 194 lb 3.2 oz (88.1 kg)   SpO2 98%   BMI 26.34 kg/m   Vesicular and maculopapular type rash on bilateral LEs consistent with poison ivy type rash. No sign of secondary bacterial infection.       Assessment & Plan:  Allergic contact dermatitis due to plants, except food - Plan: predniSONE (STERAPRED UNI-PAK 21 TAB) 10 MG (21) TBPK tablet,  Discussed limiting exposure to poison ivy since this is his second reaction in 2 months. Will treat him with steroid dose pak and discussed potential side effects of oral steroids. He will hydrate, use the topical triamcinolone and take Bendaryl at bedtime if needed. He will let me know if he notices any signs of infection.

## 2019-06-14 ENCOUNTER — Telehealth: Payer: Self-pay | Admitting: Medical

## 2019-06-14 ENCOUNTER — Other Ambulatory Visit: Payer: Self-pay

## 2019-06-14 ENCOUNTER — Other Ambulatory Visit: Payer: Self-pay | Admitting: Medical

## 2019-06-14 DIAGNOSIS — R109 Unspecified abdominal pain: Secondary | ICD-10-CM

## 2019-06-14 DIAGNOSIS — R1031 Right lower quadrant pain: Secondary | ICD-10-CM

## 2019-06-14 DIAGNOSIS — N200 Calculus of kidney: Secondary | ICD-10-CM

## 2019-06-14 DIAGNOSIS — R102 Pelvic and perineal pain: Secondary | ICD-10-CM

## 2019-06-14 NOTE — Telephone Encounter (Signed)
Please refer to [facility] for persistent right flank pain, renal stone

## 2019-06-14 NOTE — Telephone Encounter (Signed)
Referral put in Epic and Proficient

## 2019-06-21 ENCOUNTER — Telehealth: Payer: Self-pay | Admitting: Medical

## 2019-06-21 NOTE — Telephone Encounter (Signed)
Left message on voicemail with appointment time and date for referral to [facility].  07-10-19 at 8am with Dr Lovena Neighbours.

## 2019-06-21 NOTE — Telephone Encounter (Signed)
Cyndi, see his email message, check on Urology consult referral

## 2019-06-25 ENCOUNTER — Other Ambulatory Visit: Payer: Self-pay | Admitting: Medical

## 2019-07-13 ENCOUNTER — Encounter: Payer: Self-pay | Admitting: Medical

## 2019-08-01 ENCOUNTER — Other Ambulatory Visit: Payer: Self-pay | Admitting: Surgery

## 2019-11-21 ENCOUNTER — Other Ambulatory Visit: Payer: Self-pay

## 2019-11-21 ENCOUNTER — Ambulatory Visit: Payer: Managed Care, Other (non HMO) | Admitting: Medical

## 2019-11-21 ENCOUNTER — Encounter: Payer: Self-pay | Admitting: Medical

## 2019-11-21 VITALS — BP 132/88 | HR 79 | Temp 98.0°F | Ht 72.0 in | Wt 198.6 lb

## 2019-11-21 DIAGNOSIS — R030 Elevated blood-pressure reading, without diagnosis of hypertension: Secondary | ICD-10-CM | POA: Insufficient documentation

## 2019-11-21 DIAGNOSIS — Z113 Encounter for screening for infections with a predominantly sexual mode of transmission: Secondary | ICD-10-CM | POA: Diagnosis not present

## 2019-11-21 DIAGNOSIS — M79672 Pain in left foot: Secondary | ICD-10-CM | POA: Diagnosis not present

## 2019-11-21 DIAGNOSIS — Z87442 Personal history of urinary calculi: Secondary | ICD-10-CM | POA: Diagnosis not present

## 2019-11-21 DIAGNOSIS — Z0000 Encounter for general adult medical examination without abnormal findings: Secondary | ICD-10-CM | POA: Diagnosis not present

## 2019-11-21 NOTE — Progress Notes (Signed)
Subjective:   HPI  Marke Garrett is a 48 y.o. male who presents for Chief Complaint  Patient presents with  . Annual Exam    with fating labs   . Foot Pain    left bottom     Patient Care Team: , Leward Quan as PCP - General (Family Medicine) Sees dentist Sees eye doctor [facility] dermatology [facility], Dr. Ellison Hughs  Concerns: Been having some foot pain in the left foot anterior heel region on and off.  No pain first in the morning.  Wears his crocs which helps.  He recently got some shoe inserts which seem to help.  No injury no fall no trauma.  Reviewed their medical, surgical, family, social, medication, and allergy history and updated chart as appropriate.  Past Medical History:  Diagnosis Date  . Allergy   . Asthma    allergen induced asthma  . 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 Former smoker    prior occasional smoker  . GAD (generalized anxiety disorder)   . Sleep apnea 3/15   resolved with weight loss, never tolerated CPAP either    Past Surgical History:  Procedure Laterality Date  . SKIN LESION EXCISION    . WISDOM TOOTH EXTRACTION      Social History   Socioeconomic History  . Marital status: Single    Spouse name: Not on file  . Number of children: Not on file  . Years of education: Not on file  . Highest education level: Not on file  Occupational History  . Occupation: Neurosurgeon: [employer]  Social Needs  . Financial resource strain: Not on file  . Food insecurity    Worry: Not on file    Inability: Not on file  . Transportation needs    Medical: Not on file    Non-medical: Not on file  Tobacco Use  . Smoking status: Former Smoker    Packs/day: 0.25    Quit date: 03/29/2011    Years since quitting: 8.6  . Smokeless tobacco: Never Used  Substance and Sexual Activity  . Alcohol use: Yes    Alcohol/week: 0.0 standard drinks    Comment: occasionally  .  Drug use: No  . Sexual activity: Not on file  Lifestyle  . Physical activity    Days per week: Not on file    Minutes per session: Not on file  . Stress: Not on file  Relationships  . Social Herbalist on phone: Not on file    Gets together: Not on file    Attends religious service: Not on file    Active member of club or organization: Not on file    Attends meetings of clubs or organizations: Not on file    Relationship status: Not on file  . Intimate partner violence    Fear of current or ex partner: Not on file    Emotionally abused: Not on file    Physically abused: Not on file    Forced sexual activity: Not on file  Other Topics Concern  . Not on file  Social History Narrative   Lives at home by himself, single.  Exercise - stretching, walking, running, bike, weights.  Mult partners, MSM, condom use.  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 Asthma Sister   . Fibromyalgia Sister   .  Cancer Mother        breast cancer  . Cancer Maternal Grandfather 90       prostate  . Hypertension Paternal Grandmother   . Heart disease Paternal Grandfather        died in 1s of heart disease  . Cancer Other        great grandmother lung cancer  . Stroke Maternal Grandmother   . Diabetes Neg Hx      Current Outpatient Medications:  .  Ascorbic Acid (VITAMIN C) 1000 MG tablet, Take 1,000 mg by mouth daily. Reported on 03/04/2016, Disp: , Rfl:  .  FIBER PO, Take 1 capsule by mouth daily., Disp: , Rfl:  .  fluticasone (FLONASE) 50 MCG/ACT nasal spray, Place 1 spray into both nostrils daily., Disp: , Rfl:  .  HYDROcodone-acetaminophen (NORCO) 7.5-325 MG tablet, Take 1 tablet by mouth every 6 (six) hours as needed for moderate pain. (Patient not taking: Reported on 06/07/2019), Disp: 12 tablet, Rfl: 0 .  hydrocortisone (ANUSOL-HC) 2.5 % rectal cream, Place 1 application 2 (two) times daily rectally. (Patient not  taking: Reported on 06/07/2019), Disp: 30 g, Rfl: 0 .  loratadine (CLARITIN) 10 MG tablet, Take 10 mg by mouth daily., Disp: , Rfl:  .  ondansetron (ZOFRAN) 4 MG tablet, Take 1 tablet (4 mg total) by mouth daily as needed for nausea or vomiting. (Patient not taking: Reported on 06/07/2019), Disp: 30 tablet, Rfl: 0 .  predniSONE (STERAPRED UNI-PAK 21 TAB) 10 MG (21) TBPK tablet, Take by mouth daily. Take tapering dose as recommended. (Patient not taking: Reported on 11/21/2019), Disp: 21 tablet, Rfl: 0 .  tamsulosin (FLOMAX) 0.4 MG CAPS capsule, TAKE 1 CAPSULE BY MOUTH EVERY DAY (Patient not taking: Reported on 11/21/2019), Disp: 30 capsule, Rfl: 0  No Known Allergies     Review of Systems Constitutional: -fever, -chills, -sweats, -unexpected weight change, -decreased appetite, -fatigue Allergy: -sneezing, -itching, -congestion Dermatology: -changing moles, --rash, -lumps ENT: -runny nose, -ear pain, -sore throat, -hoarseness, -sinus pain, -teeth pain, - ringing in ears, -hearing loss, -nosebleeds Cardiology: -chest pain, -palpitations, -swelling, -difficulty breathing when lying flat, -waking up short of breath Respiratory: -cough, -shortness of breath, -difficulty breathing with exercise or exertion, -wheezing, -coughing up blood Gastroenterology: -abdominal pain, -nausea, -vomiting, -diarrhea, -constipation, -blood in stool, -changes in bowel movement, -difficulty swallowing or eating Hematology: -bleeding, -bruising  Musculoskeletal: -joint aches, -muscle aches, -joint swelling, -back pain, -neck pain, -cramping, -changes in gait Ophthalmology: denies vision changes, eye redness, itching, discharge Urology: -burning with urination, -difficulty urinating, -blood in urine, -urinary frequency, -urgency, -incontinence Neurology: -headache, -weakness, -tingling, -numbness, -memory loss, -falls, -dizziness Psychology: -depressed mood, -agitation, -sleep problems Male GU: no testicular mass, pain,  no lymph nodes swollen, no swelling, no rash.     Objective:  BP 132/88   Pulse 79   Temp 98 F (36.7 C)   Ht 6' (1.829 m)   Wt 198 lb 9.6 oz (90.1 kg)   SpO2 97%   BMI 26.94 kg/m    General appearance: alert, no distress, WD/WN, white male Skin: scattered macules, no worrisome lesions HEENT: normocephalic, conjunctiva/corneas normal, sclerae anicteric, PERRLA, EOMi, nares patent, no discharge or erythema, pharynx normal Oral cavity: MMM, tongue normal, teeth normal Neck: supple, no lymphadenopathy, no thyromegaly, no masses, normal ROM, no bruits Chest: non tender, normal shape and expansion Heart: RRR, normal S1, S2, no murmurs Lungs: CTA bilaterally, no wheezes, rhonchi, or rales Abdomen: +bs, soft, non tender, non distended, no masses,  no hepatomegaly, no splenomegaly, no bruits Back: non tender, normal ROM, no scoliosis Musculoskeletal: upper extremities non tender, no obvious deformity, normal ROM throughout, lower extremities non tender, no obvious deformity, normal ROM throughout Extremities: no edema, no cyanosis, no clubbing Pulses: 2+ symmetric, upper and lower extremities, normal cap refill Neurological: alert, oriented x 3, CN2-12 intact, strength normal upper extremities and lower extremities, sensation normal throughout, DTRs 2+ throughout, no cerebellar signs, gait normal Psychiatric: normal affect, behavior normal, pleasant  GU: normal male external genitalia, nontender, no masses, no hernia, no lymphadenopathy Rectal: deferred  EKG  Adult ECG Report  Indication: screen for heart disease  Rate: 70 bpm  Rhythm: normal sinus rhythm  QRS Axis: 24 degrees  PR Interval: 148 ms  QRS Duration: 106 ms  QTc: 427 ms  Conduction Disturbances: none  Other Abnormalities: none  Patient's cardiac risk factors are: prior sleep apnea  EKG comparison:2009  Narrative Interpretation: no acute changes     Assessment and Plan :   Encounter Diagnoses  Name Primary?  .  Encounter for health maintenance examination in adult Yes  . Screen for STD (sexually transmitted disease)   . History of renal stone   . Left foot pain   . Elevated blood-pressure reading without diagnosis of hypertension     Physical exam - discussed and counseled on healthy lifestyle, diet, exercise, preventative care, vaccinations, sick and well care, proper use of emergency dept and after hours care, and addressed their concerns.    Health screening: See your eye doctor yearly for routine vision care. See your dentist yearly for routine dental care including hygiene visits twice yearly.  Discussed STD testing, discussed prevention, condom use, means of transmission  Cancer screening Advised monthly self testicular exam  Colonoscopy:  Advised he check insurance for screenign now given new guidelines recommendations starting age 10yo  Discussed PSA, prostate exam, and prostate cancer screening risks/benefits.   Reviewed normal PSA from 05/2019    Vaccinations: He is up to date on flu and td vaccine    Separate significant issues discussed: Renal stones noted on CT scan earlier in the summer.  They had never passed.  We discussed watchful waiting.  Reviewed his urology notes from earlier in the summer.  Foot pain, likely plantar fasciitis versus heel spur.  Discussed supportive care, avoid going barefooted, discussed stretching, tennis ball daily massage with the foot before he gets out of the bed, wearing shoes with good arch support.  If not improving then let me know over the course of the next month.  Elevated blood pressure - will need to monitor. Discussed self monitoring at home, working on weight loss, regular exercise, lifestyle modification.  Advised he work to get back down to 185-190 lb particular given hx/o sleep apnea  Gary Garrett was seen today for annual exam and foot pain.  Diagnoses and all orders for this visit:  Encounter for health maintenance examination in  adult -     HIV regular -     RPR regular -     GC/Chlamydia Probe Amp -     Lipid Panel -     Hepatitis C antibody -     Hepatitis B surface antigen -     Comprehensive metabolic panel -     CBC -     EKG 12-Lead  Screen for STD (sexually transmitted disease) -     HIV regular -     RPR regular -     GC/Chlamydia Probe  Amp -     Hepatitis C antibody -     Hepatitis B surface antigen  History of renal stone  Left foot pain  Elevated blood-pressure reading without diagnosis of hypertension -     EKG 12-Lead    Follow-up pending labs, yearly for physical

## 2019-11-22 LAB — GC/CHLAMYDIA PROBE AMP
Chlamydia trachomatis, NAA: NEGATIVE
Neisseria Gonorrhoeae by PCR: NEGATIVE

## 2019-11-22 LAB — COMPREHENSIVE METABOLIC PANEL
ALT: 36 IU/L (ref 0–44)
AST: 22 IU/L (ref 0–40)
Albumin/Globulin Ratio: 2.1 (ref 1.2–2.2)
Albumin: 4.6 g/dL (ref 4.0–5.0)
Alkaline Phosphatase: 48 IU/L (ref 39–117)
BUN/Creatinine Ratio: 13 (ref 9–20)
BUN: 11 mg/dL (ref 6–24)
Bilirubin Total: 0.5 mg/dL (ref 0.0–1.2)
CO2: 21 mmol/L (ref 20–29)
Calcium: 9.4 mg/dL (ref 8.7–10.2)
Chloride: 102 mmol/L (ref 96–106)
Creatinine, Ser: 0.87 mg/dL (ref 0.76–1.27)
GFR calc Af Amer: 118 mL/min/{1.73_m2} (ref >59)
GFR calc non Af Amer: 102 mL/min/{1.73_m2} (ref >59)
Globulin, Total: 2.2 g/dL (ref 1.5–4.5)
Glucose: 100 mg/dL — ABNORMAL HIGH (ref 65–99)
Potassium: 4.1 mmol/L (ref 3.5–5.2)
Sodium: 139 mmol/L (ref 134–144)
Total Protein: 6.8 g/dL (ref 6.0–8.5)

## 2019-11-22 LAB — LIPID PANEL
Chol/HDL Ratio: 3.6 ratio (ref 0.0–5.0)
Cholesterol, Total: 172 mg/dL (ref 100–199)
HDL: 48 mg/dL (ref >39)
LDL Chol Calc (NIH): 110 mg/dL — ABNORMAL HIGH (ref 0–99)
Triglycerides: 74 mg/dL (ref 0–149)
VLDL Cholesterol Cal: 14 mg/dL (ref 5–40)

## 2019-11-22 LAB — CBC
Hematocrit: 43 % (ref 37.5–51.0)
Hemoglobin: 14.3 g/dL (ref 13.0–17.7)
MCH: 29.5 pg (ref 26.6–33.0)
MCHC: 33.3 g/dL (ref 31.5–35.7)
MCV: 89 fL (ref 79–97)
Platelets: 253 10*3/uL (ref 150–450)
RBC: 4.85 x10E6/uL (ref 4.14–5.80)
RDW: 12.9 % (ref 11.6–15.4)
WBC: 3.7 10*3/uL (ref 3.4–10.8)

## 2019-11-22 LAB — HEPATITIS B SURFACE ANTIGEN: Hepatitis B Surface Ag: NEGATIVE

## 2019-11-22 LAB — HEPATITIS C ANTIBODY: Hep C Virus Ab: <0.1 s/co ratio (ref 0.0–0.9)

## 2019-11-22 LAB — HIV ANTIBODY (ROUTINE TESTING W REFLEX): HIV Screen 4th Generation wRfx: NONREACTIVE

## 2019-11-22 LAB — RPR: RPR Ser Ql: NONREACTIVE

## 2019-12-15 DIAGNOSIS — Q159 Congenital malformation of eye, unspecified: Secondary | ICD-10-CM

## 2019-12-15 HISTORY — DX: Congenital malformation of eye, unspecified: Q15.9

## 2020-01-29 DIAGNOSIS — H3530 Unspecified macular degeneration: Secondary | ICD-10-CM

## 2020-01-29 DIAGNOSIS — H409 Unspecified glaucoma: Secondary | ICD-10-CM

## 2020-01-29 HISTORY — DX: Unspecified glaucoma: H40.9

## 2020-01-29 HISTORY — DX: Unspecified macular degeneration: H35.30

## 2020-02-28 ENCOUNTER — Ambulatory Visit: Payer: 59 | Admitting: Medical

## 2020-02-28 ENCOUNTER — Other Ambulatory Visit: Payer: Self-pay

## 2020-02-28 ENCOUNTER — Encounter: Payer: Self-pay | Admitting: Medical

## 2020-02-28 VITALS — BP 120/70 | HR 89 | Temp 98.6°F | Ht 72.0 in | Wt 194.8 lb

## 2020-02-28 DIAGNOSIS — L0100 Impetigo, unspecified: Secondary | ICD-10-CM | POA: Diagnosis not present

## 2020-02-28 DIAGNOSIS — A4902 Methicillin resistant Staphylococcus aureus infection, unspecified site: Secondary | ICD-10-CM | POA: Insufficient documentation

## 2020-02-28 DIAGNOSIS — B356 Tinea cruris: Secondary | ICD-10-CM | POA: Insufficient documentation

## 2020-02-28 MED ORDER — TERBINAFINE HCL 1 % EX CREA: 1.0000 "application " | TOPICAL_CREAM | Freq: Two times a day (BID) | CUTANEOUS | 0 refills | Status: DC

## 2020-02-28 MED ORDER — MUPIROCIN 2 % EX OINT: 1.0000 "application " | TOPICAL_OINTMENT | Freq: Three times a day (TID) | CUTANEOUS | 1 refills | Status: DC

## 2020-02-28 MED ORDER — CHLORHEXIDINE GLUCONATE 4 % EX LIQD: Freq: Every day | CUTANEOUS | 1 refills | Status: DC | PRN

## 2020-02-28 NOTE — Progress Notes (Signed)
Subjective: Chief Complaint  Patient presents with  . Consult    discuss mrsa    Here for f/u on skin infection.  Recent developed skin infection of scalp.   He had been scratching his head and over the course of several days started getting swollen red painful bumps on his scalp in 3 locations.  He went to dermatology, Dr. Pearline Cables.  The immediately suspected MRSA and did a swab.  I started him on Bactrim.  Within days the area got worse and he felt horrible all over in his body but no rash, no nausea or vomiting, no fever.  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  They changed him with doxycycline.  He has now been on doxycycline about 1 week.  He is much better at this point.  But he want to follow-up and discuss this finding.  He has questions about being a carrier, questions about how he got this, questions about whether this would be an STD.  He also has some recent irritation of skin on his right posterior tricep area but no rash.  He also has some irritation and slight rash in the scrotal area.  He also has questions about whether his parents are at risk since they were around him recently although he was very careful not to touch them.  No other aggravating or relieving factors.    No other c/o.  The following portions of the patient's history were reviewed and updated as appropriate: allergies, current medications, past family history, past medical history, past social history, past surgical history and problem list.  ROS Otherwise as in subjective above  Objective: BP 120/70   Pulse 89   Temp 98.6 F (37 C)   Ht 6' (1.829 m)   Wt 194 lb 12.8 oz (88.4 kg)   SpO2 98%   BMI 26.42 kg/m   General appearance: alert, no distress, well developed, well nourished The left parietal scalp is a small pinkish-brown lesion it seems to be healing from recent MRSA abscess.  No obvious erythema induration or fluctuance at this point.  No drainage at this point.   There is a scab present.  No other scalp lesion noted He has a very small 2 mm area of erythema at the base of the nose essentially above the lip. He has a faint pinkish-brown coloration of the scrotum on the left and intertriginous area There is some dry skin on the back of his arm triceps region but no rash    Assessment: Encounter Diagnoses  Name Primary?  Marland Kitchen MRSA infection Yes  . Jock itch   . Impetigo      Plan: Recent MRSA infection.  Finish the other 3 days of Doxycycline.   Wound of scalp almost resolved.  Begin Mupirocin ointment in nostrils TID x 7 days, use separately on the nose lesion.  Discussed hygiene, cleaning surfaces at home and in the car.   Parents can do similar intranasal mupirocin and Hibiclens body wash given exposure to him.  They can get these medications through their doctors  Jock itch - begin Lamisil cream topically, tried to avoid moisture in scrotal area/sweat  Impetigo - begin topical mupirocin ointment x 1 week  Of note, his MRSA was resistance to Bactrim  Carle was seen today for consult.  Diagnoses and all orders for this visit:  MRSA infection  Jock itch  Impetigo  Other orders -     chlorhexidine (HIBICLENS) 4 % external liquid;  Apply topically daily as needed. -     mupirocin ointment (BACTROBAN) 2 %; Place 1 application into the nose 3 (three) times daily. -     terbinafine (LAMISIL AT) 1 % cream; Apply 1 application topically 2 (two) times daily.    Follow up: prn

## 2020-03-05 ENCOUNTER — Encounter: Payer: Self-pay | Admitting: Medical

## 2020-03-25 ENCOUNTER — Ambulatory Visit: Payer: Self-pay | Attending: Internal Medicine

## 2020-03-25 DIAGNOSIS — Z23 Encounter for immunization: Secondary | ICD-10-CM

## 2020-03-25 NOTE — Progress Notes (Signed)
   Covid-19 Vaccination Clinic  Name:  Gary Garrett    MRN: [id number] DOB: February 02, 1971  03/25/2020  Mr. Gary Garrett was observed post Covid-19 immunization for 15 minutes without incident. He was provided with Vaccine Information Sheet and instruction to access the V-Safe system.   Mr. Gary Garrett was instructed to call 911 with any severe reactions post vaccine: Marland Kitchen Difficulty breathing  . Swelling of face and throat  . A fast heartbeat  . A bad rash all over body  . Dizziness and weakness   Immunizations Administered    Name Date Dose VIS Date Route   Pfizer COVID-19 Vaccine 03/25/2020  1:52 PM 0.3 mL 11/24/2019 Intramuscular   Manufacturer: [employer], [employer]   Lot: [id number]   [place]: [id number]

## 2020-04-15 ENCOUNTER — Ambulatory Visit: Payer: Self-pay | Attending: Internal Medicine

## 2020-04-15 DIAGNOSIS — Z23 Encounter for immunization: Secondary | ICD-10-CM

## 2020-04-15 NOTE — Progress Notes (Signed)
   Covid-19 Vaccination Clinic  Name:  Gary Garrett    MRN: [id number] DOB: [birth year]  04/15/2020  Mr. Gary Garrett was observed post Covid-19 immunization for 15 minutes without incident. He was provided with Vaccine Information Sheet and instruction to access the V-Safe system.   Mr. Gary Garrett was instructed to call 911 with any severe reactions post vaccine: Marland Kitchen Difficulty breathing  . Swelling of face and throat  . A fast heartbeat  . A bad rash all over body  . Dizziness and weakness   Immunizations Administered    Name Date Dose VIS Date Route   Pfizer COVID-19 Vaccine 04/15/2020  3:00 PM 0.3 mL 02/07/2019 Intramuscular   Manufacturer: [place]   Lot: [id number]   [place]: [id number]

## 2020-06-20 ENCOUNTER — Encounter: Payer: Self-pay | Admitting: Medical

## 2020-06-20 ENCOUNTER — Other Ambulatory Visit: Payer: Self-pay

## 2020-06-20 ENCOUNTER — Telehealth: Payer: Self-pay | Admitting: Medical

## 2020-06-20 ENCOUNTER — Ambulatory Visit: Payer: 59 | Admitting: Medical

## 2020-06-20 VITALS — BP 124/82 | HR 69 | Ht 72.0 in | Wt 194.6 lb

## 2020-06-20 DIAGNOSIS — R12 Heartburn: Secondary | ICD-10-CM | POA: Diagnosis not present

## 2020-06-20 DIAGNOSIS — N200 Calculus of kidney: Secondary | ICD-10-CM | POA: Diagnosis not present

## 2020-06-20 DIAGNOSIS — R109 Unspecified abdominal pain: Secondary | ICD-10-CM

## 2020-06-20 DIAGNOSIS — R519 Headache, unspecified: Secondary | ICD-10-CM

## 2020-06-20 DIAGNOSIS — Z566 Other physical and mental strain related to work: Secondary | ICD-10-CM

## 2020-06-20 LAB — POCT URINALYSIS DIP (PROADVANTAGE DEVICE)
Bilirubin, UA: NEGATIVE
Blood, UA: NEGATIVE
Glucose, UA: NEGATIVE mg/dL
Ketones, POC UA: NEGATIVE mg/dL
Leukocytes, UA: NEGATIVE
Nitrite, UA: NEGATIVE
Protein Ur, POC: NEGATIVE mg/dL
Specific Gravity, Urine: 1.02
Urobilinogen, Ur: NEGATIVE
pH, UA: 6 (ref 5.0–8.0)

## 2020-06-20 LAB — COMPREHENSIVE METABOLIC PANEL
ALT: 34 IU/L (ref 0–44)
AST: 21 IU/L (ref 0–40)
Albumin/Globulin Ratio: 1.9 (ref 1.2–2.2)
Albumin: 5 g/dL (ref 4.0–5.0)
Alkaline Phosphatase: 54 IU/L (ref 48–121)
BUN/Creatinine Ratio: 19 (ref 9–20)
BUN: 19 mg/dL (ref 6–24)
Bilirubin Total: 0.3 mg/dL (ref 0.0–1.2)
CO2: 24 mmol/L (ref 20–29)
Calcium: 9.7 mg/dL (ref 8.7–10.2)
Chloride: 102 mmol/L (ref 96–106)
Creatinine, Ser: 1.02 mg/dL (ref 0.76–1.27)
GFR calc Af Amer: 99 mL/min/{1.73_m2} (ref >59)
GFR calc non Af Amer: 86 mL/min/{1.73_m2} (ref >59)
Globulin, Total: 2.6 g/dL (ref 1.5–4.5)
Glucose: 103 mg/dL — ABNORMAL HIGH (ref 65–99)
Potassium: 4.7 mmol/L (ref 3.5–5.2)
Sodium: 140 mmol/L (ref 134–144)
Total Protein: 7.6 g/dL (ref 6.0–8.5)

## 2020-06-20 MED ORDER — HYDROCODONE-ACETAMINOPHEN 5-325 MG PO TABS: 1.0000 | ORAL_TABLET | Freq: Four times a day (QID) | ORAL | 0 refills | Status: DC | PRN

## 2020-06-20 MED ORDER — OMEPRAZOLE 40 MG PO CPDR: 40.0000 mg | DELAYED_RELEASE_CAPSULE | Freq: Every day | ORAL | 0 refills | Status: DC

## 2020-06-20 MED ORDER — ONDANSETRON HCL 4 MG PO TABS: 4.0000 mg | ORAL_TABLET | Freq: Three times a day (TID) | ORAL | 0 refills | Status: DC | PRN

## 2020-06-20 NOTE — Telephone Encounter (Signed)
Please call him.  Urine was ok.  Recommendations:  Avoid acidic and spicy foods, peppers, spicy foods  cut back or cut out alcohol, avoiding going to alcohol for stress coping mechanism  Hydrate well with water to flush the kidneys  Over the weekend if worse pain suggestive of passing stone, you can use Zofran for nausea, Hydrocodone for pain as needed, strain urine to collect stone debris  Begin omeprazole to help with likely gastritis.    Follow up as scheduled with urology and follow-up about 3 weeks away  Work on stress reduction, balance with work and home life

## 2020-06-20 NOTE — Telephone Encounter (Signed)
Patient has been informed.

## 2020-06-20 NOTE — Progress Notes (Signed)
Subjective: Chief Complaint  Patient presents with  . Headache  . Fatigue    with side pain or soreness on right side   . Dizziness   Not feeling good in right flank and in general.   Has been drinking some more alcohol than usual the last few weeks.    Still worried about kidney stones identified last year on CT that never passed.  He plans to f/u with Urology, Dr. Lovena Neighbours .   Been having some headaches.  Been having some burning in throat.   Has been eating some salty foods.    Feels stressed at work.  Denies depression, but just stressed.    No numbness, tingling, confusion, weakness.  Headaches are intermittent, lasting for a few hours.  Not severe.  No vision loss or hearing change   Past Medical History:  Diagnosis Date  . Allergy   . Asthma    allergen induced asthma  . 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 Former smoker    prior occasional smoker  . GAD (generalized anxiety disorder)   . Glaucoma 01/29/2020  . Macular degeneration of both eyes 01/29/2020  . Sleep apnea 3/15   resolved with weight loss, never tolerated CPAP either   Current Outpatient Medications on File Prior to Visit  Medication Sig Dispense Refill  . Ascorbic Acid (VITAMIN C) 1000 MG tablet Take 1,000 mg by mouth daily. Reported on 03/04/2016    . FIBER PO Take 1 capsule by mouth daily.    . fluticasone (FLONASE) 50 MCG/ACT nasal spray Place 1 spray into both nostrils daily.    Marland Kitchen terbinafine (LAMISIL AT) 1 % cream Apply 1 application topically 2 (two) times daily. 30 g 0  . chlorhexidine (HIBICLENS) 4 % external liquid Apply topically daily as needed. (Patient not taking: Reported on 06/20/2020) 120 mL 1  . doxycycline (VIBRAMYCIN) 100 MG capsule Take 100 mg by mouth 2 (two) times daily. (Patient not taking: Reported on 06/20/2020)    . HYDROcodone-acetaminophen (NORCO) 7.5-325 MG tablet Take 1 tablet by mouth every 6 (six) hours as needed for moderate pain. (Patient not taking:  Reported on 06/07/2019) 12 tablet 0  . hydrocortisone (ANUSOL-HC) 2.5 % rectal cream Place 1 application 2 (two) times daily rectally. (Patient not taking: Reported on 06/07/2019) 30 g 0  . loratadine (CLARITIN) 10 MG tablet Take 10 mg by mouth daily. (Patient not taking: Reported on 06/20/2020)    . mupirocin ointment (BACTROBAN) 2 % APPLY A SMALL AMOUNT TO AFFECTED AREA (SCALP LESIONS) THREE TIMES A DAY AS NEEDED (Patient not taking: Reported on 06/20/2020)    . mupirocin ointment (BACTROBAN) 2 % Place 1 application into the nose 3 (three) times daily. (Patient not taking: Reported on 06/20/2020) 30 g 1  . predniSONE (STERAPRED UNI-PAK 21 TAB) 10 MG (21) TBPK tablet Take by mouth daily. Take tapering dose as recommended. (Patient not taking: Reported on 11/21/2019) 21 tablet 0  . tamsulosin (FLOMAX) 0.4 MG CAPS capsule TAKE 1 CAPSULE BY MOUTH EVERY DAY (Patient not taking: Reported on 11/21/2019) 30 capsule 0   No current facility-administered medications on file prior to visit.   ROS as in subjective     Objective: BP 124/82   Pulse 69   Ht 6' (1.829 m)   Wt 194 lb 9.6 oz (88.3 kg)   SpO2 98%   BMI 26.39 kg/m   Wt Readings from Last 3 Encounters:  06/20/20 194 lb 9.6 oz (  88.3 kg)  02/28/20 194 lb 12.8 oz (88.4 kg)  11/21/19 198 lb 9.6 oz (90.1 kg)   BP Readings from Last 3 Encounters:  06/20/20 124/82  02/28/20 120/70  11/21/19 132/88     General appearence: alert, no distress, WD/WN,  HEENT: normocephalic, sclerae anicteric, PERRLA, EOMi, nares patent, no discharge or erythema, pharynx normal Neck: supple, no lymphadenopathy, no thyromegaly, no masses Heart: RRR, normal S1, S2, no murmurs Lungs: CTA bilaterally, no wheezes, rhonchi, or rales Abdomen: +bs, soft, mild right mid abdomen tenderness, otherwise non tender, non distended, no masses, no hepatomegaly, no splenomegaly Back: non tender, no CVA tenderness Pulses: 2+ symmetric, upper and lower extremities, normal cap  refill Neurological: alert, oriented x 3, CN2-12 intact, strength normal upper extremities and lower extremities, sensation normal throughout, DTRs 2+ throughout, no cerebellar signs, gait normal Psychiatric: normal affect, behavior normal, pleasant     Assessment: Encounter Diagnoses  Name Primary?  . Flank pain Yes  . Renal stones   . Heartburn   . Nonintractable headache, unspecified chronicity pattern, unspecified headache type   . Stress at work      Plan: We discussed his several symptoms  Advised GERD trigger avoidance, cut back or cut out alcohol, avoiding going to alcohol for stress coping mechanism.  Consider counseling although he does not feel like his issue is that big of an issue  Hydrate well.  We discussed the possibility of kidney stones moving.  I reviewed his July 2020 CT abdomen pelvis showing renal stones maximum 4 mm size in both kidneys.   Medication as below for pain and nausea as needed over the weekend if the stones move.  He has a strainer to strain urine in the event of stone passing  Begin omeprazole to help with likely gastritis.  Discussed supportive care for GERD and heartburn  He already has an appointment scheduled with urology and follow-up about 3 weeks away  We discussed stress reduction, balance with work and home life   Mcguire was seen today for headache, fatigue and dizziness.  Diagnoses and all orders for this visit:  Flank pain -     Comprehensive metabolic panel -     POCT Urinalysis DIP (Proadvantage Device)  Renal stones -     Comprehensive metabolic panel -     POCT Urinalysis DIP (Proadvantage Device)  Heartburn -     Comprehensive metabolic panel  Nonintractable headache, unspecified chronicity pattern, unspecified headache type  Stress at work  Other orders -     ondansetron (ZOFRAN) 4 MG tablet; Take 1 tablet (4 mg total) by mouth every 8 (eight) hours as needed for nausea or vomiting. -     HYDROcodone-acetaminophen  (NORCO) 5-325 MG tablet; Take 1 tablet by mouth every 6 (six) hours as needed. -     omeprazole (PRILOSEC) 40 MG capsule; Take 1 capsule (40 mg total) by mouth daily.   Follow-up pending labs, follow-up with urology

## 2020-07-26 IMAGING — CR ABDOMEN - 1 VIEW
2 series · 2 of 2 positions shown · non-contrast
Comparison: Abdomen and pelvis CT, 11/08/2017

CLINICAL DATA: Right lower quadrant pain for 2 weeks. Possible
kidney stones.

EXAM:
ABDOMEN - 1 VIEW

[t abdomen supine (1 of 2)]
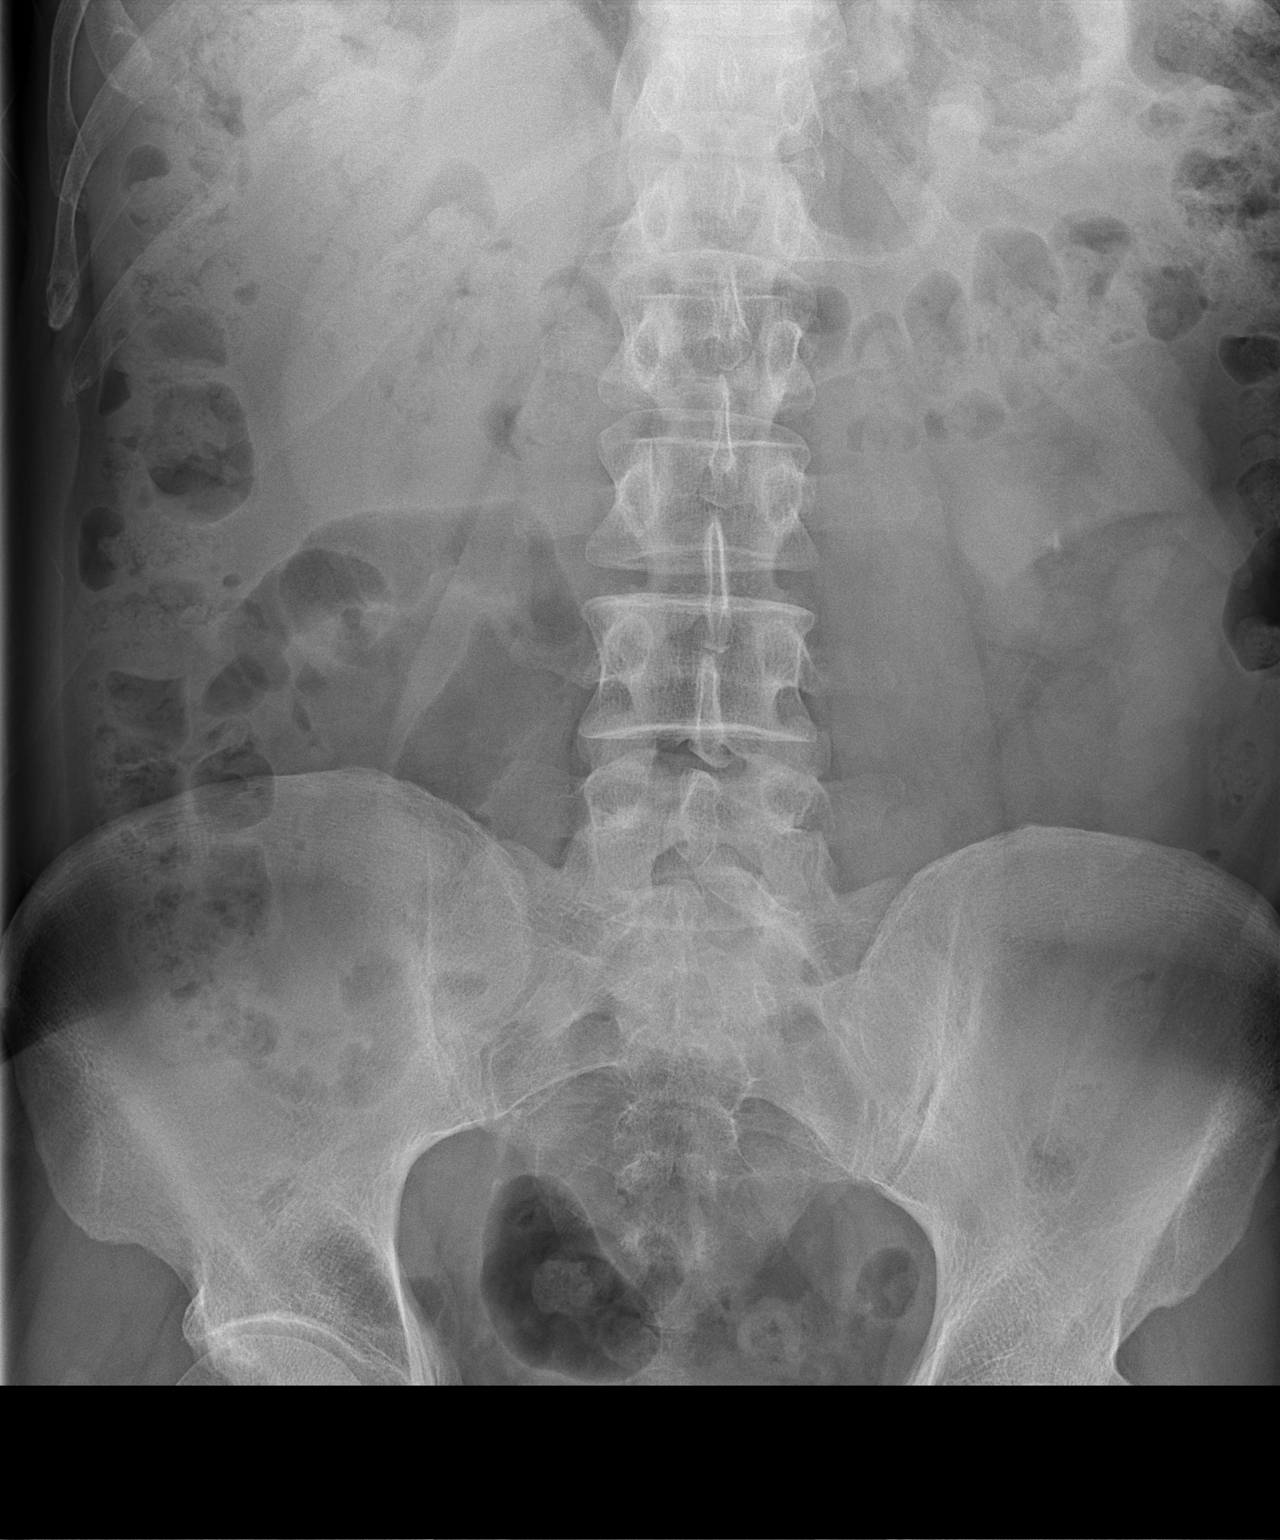

[t abdomen supine (2 of 2)]
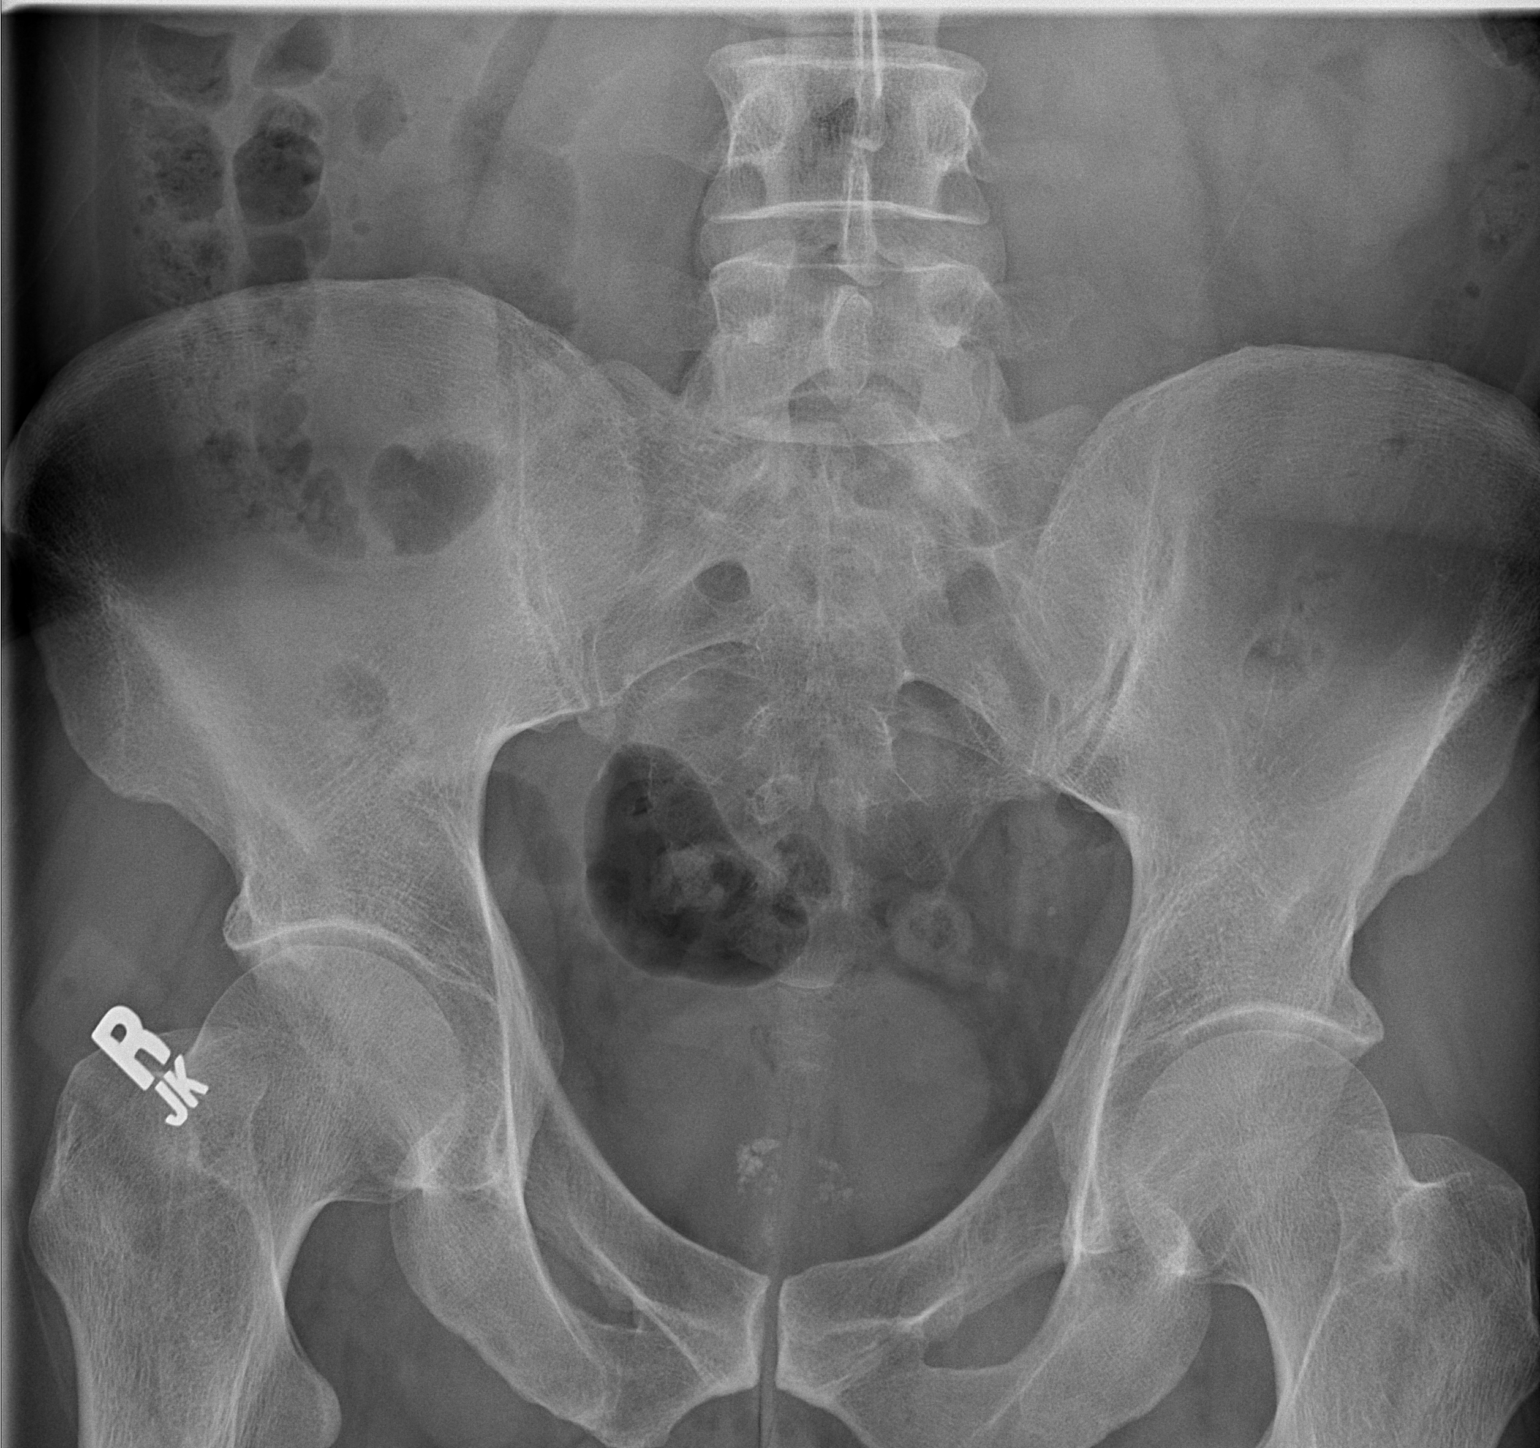

[2 of 2 positions shown; findings below may reference images not displayed]

FINDINGS: Normal bowel gas pattern.

6 mm stone projects in the lower pole of the left kidney stable from
the prior CT. No other evidence of an intrarenal stone. No evidence
of a ureteral stone.

Stable prostate calcifications.  No other soft tissue abnormality.

No skeletal abnormality.
IMPRESSION: 1. No acute findings.  No evidence of a ureteral stone.
2. Stable intrarenal stone in the lower pole of the left kidney.

## 2020-12-02 ENCOUNTER — Telehealth: Payer: Self-pay | Admitting: Medical

## 2020-12-02 ENCOUNTER — Other Ambulatory Visit: Payer: Self-pay | Admitting: Medical

## 2020-12-02 MED ORDER — SCOPOLAMINE 1 MG/3DAYS TD PT72: 1.0000 | MEDICATED_PATCH | TRANSDERMAL | 1 refills | Status: DC

## 2020-12-02 NOTE — Telephone Encounter (Signed)
Pt called and is requesting a motion sickness patch he is going on a trip Jan the 9th to the [place]th on a ship, pt states he gets a little motion sick, pt uses [id number] - [place], [place] - [place]

## 2020-12-02 NOTE — Telephone Encounter (Signed)
I sent Rx for scopolamine patch which can cause blurred vision, drowsiness.    Usually works well though for motion sickness.

## 2020-12-05 NOTE — Telephone Encounter (Signed)
Called and informed pt that rx was sent to the pharmacy °

## 2020-12-20 ENCOUNTER — Encounter: Payer: 59 | Admitting: Medical

## 2021-01-07 ENCOUNTER — Encounter: Payer: Self-pay | Admitting: Medical

## 2021-01-07 ENCOUNTER — Ambulatory Visit: Payer: 59 | Admitting: Medical

## 2021-01-07 ENCOUNTER — Other Ambulatory Visit: Payer: Self-pay

## 2021-01-07 VITALS — BP 130/86 | HR 71 | Ht 72.0 in | Wt 201.2 lb

## 2021-01-07 DIAGNOSIS — Z113 Encounter for screening for infections with a predominantly sexual mode of transmission: Secondary | ICD-10-CM | POA: Diagnosis not present

## 2021-01-07 DIAGNOSIS — Z125 Encounter for screening for malignant neoplasm of prostate: Secondary | ICD-10-CM | POA: Insufficient documentation

## 2021-01-07 DIAGNOSIS — R030 Elevated blood-pressure reading, without diagnosis of hypertension: Secondary | ICD-10-CM

## 2021-01-07 DIAGNOSIS — Z0000 Encounter for general adult medical examination without abnormal findings: Secondary | ICD-10-CM | POA: Diagnosis not present

## 2021-01-07 DIAGNOSIS — Z87442 Personal history of urinary calculi: Secondary | ICD-10-CM | POA: Diagnosis not present

## 2021-01-07 DIAGNOSIS — G4700 Insomnia, unspecified: Secondary | ICD-10-CM

## 2021-01-07 DIAGNOSIS — Z1211 Encounter for screening for malignant neoplasm of colon: Secondary | ICD-10-CM | POA: Insufficient documentation

## 2021-01-07 DIAGNOSIS — Z13220 Encounter for screening for lipoid disorders: Secondary | ICD-10-CM | POA: Insufficient documentation

## 2021-01-07 DIAGNOSIS — Z8614 Personal history of Methicillin resistant Staphylococcus aureus infection: Secondary | ICD-10-CM | POA: Insufficient documentation

## 2021-01-07 NOTE — Progress Notes (Signed)
Subjective:   HPI  Gary Garrett is a 50 y.o. male who presents for Chief Complaint  Patient presents with  . Annual Exam    Physical with fasting labs. Due for colonoscopy    Patient Care Team: Dorathy Stallone, Leward Quan as PCP - General (Family Medicine) Sees dentist Sees eye doctor [facility]  Concerns: none  Reviewed their medical, surgical, family, social, medication, and allergy history and updated chart as appropriate.  Past Medical History:  Diagnosis Date  . Allergy   . Asthma    allergen induced asthma  . 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 Former smoker    prior occasional smoker  . GAD (generalized anxiety disorder)   . Glaucoma 01/29/2020  . Macular degeneration of both eyes 01/29/2020  . Sleep apnea 3/15   resolved with weight loss, never tolerated CPAP either    Past Surgical History:  Procedure Laterality Date  . SKIN LESION EXCISION    . WISDOM TOOTH EXTRACTION      Family History  Problem Relation Age of Onset  . Asthma Sister   . Fibromyalgia Sister   . Cancer Mother        breast cancer  . Cancer Maternal Grandfather 90       prostate  . Hypertension Paternal Grandmother   . Heart disease Paternal Grandfather        died in 12s of heart disease  . Cancer Other        great grandmother lung cancer  . Stroke Maternal Grandmother   . Diabetes Neg Hx      Current Outpatient Medications:  .  FIBER PO, Take 1 capsule by mouth daily., Disp: , Rfl:  .  fluticasone (FLONASE) 50 MCG/ACT nasal spray, Place 1 spray into both nostrils daily., Disp: , Rfl:  .  omeprazole (PRILOSEC) 40 MG capsule, Take 1 capsule (40 mg total) by mouth daily., Disp: 30 capsule, Rfl: 0 .  scopolamine (TRANSDERM-SCOP, 1.5 MG,) 1 MG/3DAYS, Place 1 patch (1.5 mg total) onto the skin every 3 (three) days. (Patient not taking: Reported on 01/07/2021), Disp: 4 patch, Rfl: 1  No Known Allergies    Review of Systems Constitutional: -fever,  -chills, -sweats, -unexpected weight change, -decreased appetite, -fatigue Allergy: -sneezing, -itching, -congestion Dermatology: -changing moles, --rash, -lumps ENT: -runny nose, -ear pain, -sore throat, -hoarseness, -sinus pain, -teeth pain, - ringing in ears, -hearing loss, -nosebleeds Cardiology: -chest pain, -palpitations, -swelling, -difficulty breathing when lying flat, -waking up short of breath Respiratory: -cough, -shortness of breath, -difficulty breathing with exercise or exertion, -wheezing, -coughing up blood Gastroenterology: -abdominal pain, -nausea, -vomiting, -diarrhea, -constipation, -blood in stool, -changes in bowel movement, -difficulty swallowing or eating Hematology: -bleeding, -bruising  Musculoskeletal: -joint aches, -muscle aches, -joint swelling, -back pain, -neck pain, -cramping, -changes in gait Ophthalmology: denies vision changes, eye redness, itching, discharge Urology: -burning with urination, -difficulty urinating, -blood in urine, -urinary frequency, -urgency, -incontinence Neurology: -headache, -weakness, -tingling, -numbness, -memory loss, -falls, -dizziness Psychology: -depressed mood, -agitation, -sleep problems Male GU: no testicular mass, pain, no lymph nodes swollen, no swelling, no rash.     Objective:  BP (!) 136/98   Pulse 71   Ht 6' (1.829 m)   Wt 201 lb 3.2 oz (91.3 kg)   SpO2 97%   BMI 27.29 kg/m   General appearance: alert, no distress, WD/WN, Caucasian male Skin: no worrisome lesions HEENT: normocephalic, conjunctiva/corneas normal, sclerae anicteric, PERRLA, EOMi, nares patent,  no discharge or erythema, pharynx normal Oral cavity: MMM, tongue normal, teeth normal Neck: supple, no lymphadenopathy, no thyromegaly, no masses, normal ROM, no bruits Chest: non tender, normal shape and expansion Heart: RRR, normal S1, S2, no murmurs Lungs: CTA bilaterally, no wheezes, rhonchi, or rales Abdomen: +bs, soft, non tender, non distended, no  masses, no hepatomegaly, no splenomegaly, no bruits Back: non tender, normal ROM, no scoliosis Musculoskeletal: upper extremities non tender, no obvious deformity, normal ROM throughout, lower extremities non tender, no obvious deformity, normal ROM throughout Extremities: no edema, no cyanosis, no clubbing Pulses: 2+ symmetric, upper and lower extremities, normal cap refill Neurological: alert, oriented x 3, CN2-12 intact, strength normal upper extremities and lower extremities, sensation normal throughout, DTRs 2+ throughout, no cerebellar signs, gait normal Psychiatric: normal affect, behavior normal, pleasant  GU: normal male external genitalia,circumcised, nontender, no masses, no hernia, no lymphadenopathy Rectal: anus normal tone, prostate mildly enlarged, no nodules   Assessment and Plan :   Encounter Diagnoses  Name Primary?  . Routine general medical examination at a health care facility Yes  . Screen for STD (sexually transmitted disease)   . Elevated blood-pressure reading without diagnosis of hypertension   . History of renal stone   . Insomnia, unspecified type   . Encounter for health maintenance examination in adult   . History of MRSA infection   . Screen for colon cancer   . Screening for prostate cancer   . Screening for lipid disorders     Today you had a preventative care visit or wellness visit.    Topics today may have included healthy lifestyle, diet, exercise, preventative care, vaccinations, sick and well care, proper use of emergency dept and after hours care, as well as other concerns.     Recommendations: Continue to return yearly for your annual wellness and preventative care visits.  This gives [place] a chance to discuss healthy lifestyle, exercise, vaccinations, review your chart record, and perform screenings where appropriate.  I recommend you see your eye doctor yearly for routine vision care.  I recommend you see your dentist yearly for routine  dental care including hygiene visits twice yearly.   Vaccination recommendations were reviewed You are up-to-date on tetanus vaccine 2015 You are up to date on hepatitis A and hepatitis B vaccine You are up-to-date on the initial [place] Covid vaccine You are up to date on booster of covid per your record You are up to date on flu shot per your record  Shingles vaccine:  I recommend you have a shingles vaccine to help prevent shingles or herpes zoster outbreak.   Please call your insurer to inquire about coverage for the Shingrix vaccine given in 2 doses.   Some insurers cover this vaccine after age 51, some cover this after age 28.  If your insurer covers this, then call to schedule appointment to have this vaccine here.    Screening for cancer: Colon cancer screening:  Discussed Cologuard versus colonoscopy.   Let me know which you want to do  We discussed PSA, prostate exam, and prostate cancer screening risks/benefits.     Skin cancer screening: Check your skin regularly for new changes, growing lesions, or other lesions of concern Come in for evaluation if you have skin lesions of concern.  Lung cancer screening: If you have a greater than 30 pack year history of tobacco use, then you qualify for lung cancer screening with a chest CT scan  We currently don't have screenings for  other cancers besides breast, cervical, colon, and lung cancers.  If you have a strong family history of cancer or have other cancer screening concerns, please let me know.    Bone health: Get at least 150 minutes of aerobic exercise weekly Get weight bearing exercise at least once weekly   Heart health: Get at least 150 minutes of aerobic exercise weekly Limit alcohol It is important to maintain a healthy blood pressure and healthy cholesterol numbers    Gary Garrett was seen today for annual exam.  Diagnoses and all orders for this visit:  Routine general medical examination at a health care  facility -     Comprehensive metabolic panel -     CBC with Differential/Platelet -     PSA -     Lipid panel -     HIV Antibody (routine testing w rflx) -     RPR -     GC/Chlamydia Probe Amp  Screen for STD (sexually transmitted disease) -     HIV Antibody (routine testing w rflx) -     RPR -     GC/Chlamydia Probe Amp  Elevated blood-pressure reading without diagnosis of hypertension  History of renal stone  Insomnia, unspecified type  Encounter for health maintenance examination in adult  History of MRSA infection  Screen for colon cancer  Screening for prostate cancer -     PSA  Screening for lipid disorders -     Lipid panel     Follow-up pending labs, yearly for physical

## 2021-01-07 NOTE — Patient Instructions (Signed)
Today you had a preventative care visit or wellness visit.    Topics today may have included healthy lifestyle, diet, exercise, preventative care, vaccinations, sick and well care, proper use of emergency dept and after hours care, as well as other concerns.     Recommendations: Continue to return yearly for your annual wellness and preventative care visits.  This gives [place] a chance to discuss healthy lifestyle, exercise, vaccinations, review your chart record, and perform screenings where appropriate.  I recommend you see your eye doctor yearly for routine vision care.  I recommend you see your dentist yearly for routine dental care including hygiene visits twice yearly.   Vaccination recommendations were reviewed You are up-to-date on tetanus vaccine 2015 You are up to date on hepatitis A and hepatitis B vaccine You are up-to-date on the initial [place] Covid vaccine You are up to date on booster of covid per your record You are up to date on flu shot per your record  Shingles vaccine:  I recommend you have a shingles vaccine to help prevent shingles or herpes zoster outbreak.   Please call your insurer to inquire about coverage for the Shingrix vaccine given in 2 doses.   Some insurers cover this vaccine after age 53, some cover this after age 53.  If your insurer covers this, then call to schedule appointment to have this vaccine here.    Screening for cancer: Colon cancer screening:  Discussed Cologuard versus colonoscopy.   Let me know which you want to do  We discussed PSA, prostate exam, and prostate cancer screening risks/benefits.     Skin cancer screening: Check your skin regularly for new changes, growing lesions, or other lesions of concern Come in for evaluation if you have skin lesions of concern.  Lung cancer screening: If you have a greater than 30 pack year history of tobacco use, then you qualify for lung cancer screening with a chest CT scan  We currently don't  have screenings for other cancers besides breast, cervical, colon, and lung cancers.  If you have a strong family history of cancer or have other cancer screening concerns, please let me know.    Bone health: Get at least 150 minutes of aerobic exercise weekly Get weight bearing exercise at least once weekly   Heart health: Get at least 150 minutes of aerobic exercise weekly Limit alcohol It is important to maintain a healthy blood pressure and healthy cholesterol numbers

## 2021-01-08 ENCOUNTER — Other Ambulatory Visit: Payer: Self-pay

## 2021-01-08 DIAGNOSIS — Z1211 Encounter for screening for malignant neoplasm of colon: Secondary | ICD-10-CM

## 2021-01-08 LAB — CBC WITH DIFFERENTIAL/PLATELET
Basophils Absolute: 0 10*3/uL (ref 0.0–0.2)
Basos: 1 %
EOS (ABSOLUTE): 0.2 10*3/uL (ref 0.0–0.4)
Eos: 4 %
Hematocrit: 44.1 % (ref 37.5–51.0)
Hemoglobin: 14.9 g/dL (ref 13.0–17.7)
Immature Grans (Abs): 0 10*3/uL (ref 0.0–0.1)
Immature Granulocytes: 0 %
Lymphocytes Absolute: 1.7 10*3/uL (ref 0.7–3.1)
Lymphs: 43 %
MCH: 28.9 pg (ref 26.6–33.0)
MCHC: 33.8 g/dL (ref 31.5–35.7)
MCV: 86 fL (ref 79–97)
Monocytes Absolute: 0.6 10*3/uL (ref 0.1–0.9)
Monocytes: 14 %
Neutrophils Absolute: 1.5 10*3/uL (ref 1.4–7.0)
Neutrophils: 38 %
Platelets: 255 10*3/uL (ref 150–450)
RBC: 5.15 x10E6/uL (ref 4.14–5.80)
RDW: 12.7 % (ref 11.6–15.4)
WBC: 4 10*3/uL (ref 3.4–10.8)

## 2021-01-08 LAB — COMPREHENSIVE METABOLIC PANEL
ALT: 38 IU/L (ref 0–44)
AST: 20 IU/L (ref 0–40)
Albumin/Globulin Ratio: 1.9 (ref 1.2–2.2)
Albumin: 4.5 g/dL (ref 4.0–5.0)
Alkaline Phosphatase: 51 IU/L (ref 44–121)
BUN/Creatinine Ratio: 16 (ref 9–20)
BUN: 16 mg/dL (ref 6–24)
Bilirubin Total: 0.6 mg/dL (ref 0.0–1.2)
CO2: 26 mmol/L (ref 20–29)
Calcium: 9.5 mg/dL (ref 8.7–10.2)
Chloride: 102 mmol/L (ref 96–106)
Creatinine, Ser: 0.99 mg/dL (ref 0.76–1.27)
GFR calc Af Amer: 102 mL/min/{1.73_m2} (ref >59)
GFR calc non Af Amer: 88 mL/min/{1.73_m2} (ref >59)
Globulin, Total: 2.4 g/dL (ref 1.5–4.5)
Glucose: 102 mg/dL — ABNORMAL HIGH (ref 65–99)
Potassium: 4.7 mmol/L (ref 3.5–5.2)
Sodium: 142 mmol/L (ref 134–144)
Total Protein: 6.9 g/dL (ref 6.0–8.5)

## 2021-01-08 LAB — LIPID PANEL
Chol/HDL Ratio: 5.1 ratio — ABNORMAL HIGH (ref 0.0–5.0)
Cholesterol, Total: 202 mg/dL — ABNORMAL HIGH (ref 100–199)
HDL: 40 mg/dL (ref >39)
LDL Chol Calc (NIH): 141 mg/dL — ABNORMAL HIGH (ref 0–99)
Triglycerides: 117 mg/dL (ref 0–149)
VLDL Cholesterol Cal: 21 mg/dL (ref 5–40)

## 2021-01-08 LAB — GC/CHLAMYDIA PROBE AMP
Chlamydia trachomatis, NAA: NEGATIVE
Neisseria Gonorrhoeae by PCR: NEGATIVE

## 2021-01-08 LAB — HIV ANTIBODY (ROUTINE TESTING W REFLEX): HIV Screen 4th Generation wRfx: NONREACTIVE

## 2021-01-08 LAB — PSA: Prostate Specific Ag, Serum: 0.7 ng/mL (ref 0.0–4.0)

## 2021-01-08 LAB — RPR: RPR Ser Ql: NONREACTIVE

## 2021-01-09 ENCOUNTER — Other Ambulatory Visit: Payer: Self-pay | Admitting: Medical

## 2021-01-09 MED ORDER — ROSUVASTATIN CALCIUM 10 MG PO TABS: 10.0000 mg | ORAL_TABLET | Freq: Every day | ORAL | 3 refills | Status: DC

## 2021-01-13 ENCOUNTER — Other Ambulatory Visit (INDEPENDENT_AMBULATORY_CARE_PROVIDER_SITE_OTHER): Payer: 59

## 2021-01-13 ENCOUNTER — Other Ambulatory Visit: Payer: Self-pay

## 2021-01-13 VITALS — BP 138/90

## 2021-01-13 DIAGNOSIS — Z23 Encounter for immunization: Secondary | ICD-10-CM | POA: Diagnosis not present

## 2021-04-09 ENCOUNTER — Encounter: Payer: Self-pay | Admitting: Medical

## 2021-04-09 ENCOUNTER — Other Ambulatory Visit: Payer: Self-pay

## 2021-04-09 ENCOUNTER — Ambulatory Visit: Payer: 59 | Admitting: Medical

## 2021-04-09 VITALS — BP 132/82 | HR 75 | Ht 72.0 in | Wt 180.6 lb

## 2021-04-09 DIAGNOSIS — Z1211 Encounter for screening for malignant neoplasm of colon: Secondary | ICD-10-CM | POA: Diagnosis not present

## 2021-04-09 DIAGNOSIS — Z13220 Encounter for screening for lipoid disorders: Secondary | ICD-10-CM

## 2021-04-09 DIAGNOSIS — Z8249 Family history of ischemic heart disease and other diseases of the circulatory system: Secondary | ICD-10-CM

## 2021-04-09 DIAGNOSIS — Z23 Encounter for immunization: Secondary | ICD-10-CM

## 2021-04-09 DIAGNOSIS — R030 Elevated blood-pressure reading, without diagnosis of hypertension: Secondary | ICD-10-CM

## 2021-04-09 NOTE — Progress Notes (Signed)
Subjective:  Gary Garrett is a 50 y.o. male who presents for Chief Complaint  Patient presents with  . Follow-up    Pt present to follow up on blood pressure and cholesterol. Pt will also be getting 2nd shingrix vaccine as well.      Here for f/u.   Last visit at physical he had elevated BP, elevated cholesterol.  Has been doing much better with diet.  Has been doing some exercise, but needs to do more.  Has lost weight since last visit.  Sleeping is better, breathing is better, feels better overall.   He did not start Crestor from last visit.  He plans to do colonoscopy  He is here for 2nd Shingrix vaccine  No other aggravating or relieving factors.    No other c/o.  Past Medical History:  Diagnosis Date  . Allergy   . Asthma    allergen induced asthma  . 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 Former smoker    prior occasional smoker  . GAD (generalized anxiety disorder)   . Glaucoma 01/29/2020  . Macular degeneration of both eyes 01/29/2020  . Sleep apnea 3/15   resolved with weight loss, never tolerated CPAP either   Current Outpatient Medications on File Prior to Visit  Medication Sig Dispense Refill  . FIBER PO Take 1 capsule by mouth daily.    . fluticasone (FLONASE) 50 MCG/ACT nasal spray Place 1 spray into both nostrils daily.    . rosuvastatin (CRESTOR) 10 MG tablet Take 1 tablet (10 mg total) by mouth daily. (Patient not taking: Reported on 04/09/2021) 90 tablet 3   No current facility-administered medications on file prior to visit.   Family History  Problem Relation Age of Onset  . Asthma Sister   . Fibromyalgia Sister   . Cancer Mother        breast cancer  . Cancer Maternal Grandfather 90       prostate  . Hypertension Paternal Grandmother   . Heart disease Paternal Grandfather        died in 22s of heart disease  . Cancer Other        great grandmother lung cancer  . Stroke Maternal Grandmother   . Diabetes Neg Hx       The following portions of the patient's history were reviewed and updated as appropriate: allergies, current medications, past family history, past medical history, past social history, past surgical history and problem list.  ROS Otherwise as in subjective above    Objective: BP 132/82   Pulse 75   Ht 6' (1.829 m)   Wt 180 lb 9.6 oz (81.9 kg)   SpO2 96%   BMI 24.49 kg/m   Wt Readings from Last 3 Encounters:  04/09/21 180 lb 9.6 oz (81.9 kg)  01/07/21 201 lb 3.2 oz (91.3 kg)  06/20/20 194 lb 9.6 oz (88.3 kg)   BP Readings from Last 3 Encounters:  04/09/21 132/82  01/13/21 138/90  01/07/21 130/86    General appearance: alert, no distress, well developed, well nourished Heart: RRR, normal S1, S2, no murmurs Lungs: CTA bilaterally, no wheezes, rhonchi, or rales Pulses: 2+ radial pulses, 2+ pedal pulses, normal cap refill Ext: no edema   Assessment: Encounter Diagnoses  Name Primary?  . Elevated blood-pressure reading without diagnosis of hypertension Yes  . Screening for colon cancer   . Screening for lipid disorders   . Family history of heart disease   .  Need for shingles vaccine      Plan: Elevated BP - congratulated him on the weight loss and lifestyle changes.  He will continue to monitor blood pressures 2 times per week.  Goal < 130/80.   Your numbers are improved today  Hyperlipidemia - ASCVD Risk:  Current 10-Year risk 4.6%,  Lifetime ASCVD Risk:  46%, Optimal ASCVD Risk:    2.1%.   Advised CT calcium cardiac score.  He will consider.   He wants to hold off on Crestor for now.  Referral for colonoscopy  Counseled on the Shingrix vaccine.  Vaccine information sheet given. Shingrix #2 vaccine given after consent obtained.     [name] was seen today for follow-up.  Diagnoses and all orders for this visit:  Elevated blood-pressure reading without diagnosis of hypertension -     Lipid panel  Screening for colon cancer -     Ambulatory referral to  Gastroenterology  Screening for lipid disorders -     Lipid panel  Family history of heart disease  Need for shingles vaccine  Other orders -     Varicella-zoster vaccine IM (Shingrix)    Follow up: pending labs

## 2021-04-10 LAB — LIPID PANEL
Chol/HDL Ratio: 2.7 ratio (ref 0.0–5.0)
Cholesterol, Total: 162 mg/dL (ref 100–199)
HDL: 60 mg/dL (ref >39)
LDL Chol Calc (NIH): 91 mg/dL (ref 0–99)
Triglycerides: 54 mg/dL (ref 0–149)
VLDL Cholesterol Cal: 11 mg/dL (ref 5–40)

## 2021-04-21 ENCOUNTER — Encounter: Payer: Self-pay | Admitting: Gastroenterology

## 2021-06-13 HISTORY — PX: COLONOSCOPY: SHX174

## 2021-06-23 ENCOUNTER — Ambulatory Visit (AMBULATORY_SURGERY_CENTER): Payer: 59

## 2021-06-23 ENCOUNTER — Other Ambulatory Visit: Payer: Self-pay

## 2021-06-23 VITALS — Ht 72.0 in | Wt 180.0 lb

## 2021-06-23 DIAGNOSIS — Z1211 Encounter for screening for malignant neoplasm of colon: Secondary | ICD-10-CM

## 2021-06-23 MED ORDER — PLENVU 140 G PO SOLR: 1.0000 | Freq: Once | ORAL | 0 refills | Status: AC

## 2021-06-23 NOTE — Progress Notes (Signed)
No egg or soy allergy known to patient  No issues with past sedation with any surgeries or procedures Patient denies ever being told they had issues or difficulty with intubation (pt states he has never been intubated) No FH of Malignant Hyperthermia No diet pills per patient No home 02 use per patient  No blood thinners per patient  Pt denies issues with constipation  No A fib or A flutter  EMMI video to pt or via [place] 19 guidelines implemented in [place] today with Pt and RN  Pt is fully vaccinated  for Covid   Plenvu Coupon given to pt in [facility] today , Code to Pharmacy and  NO PA's for preps discussed with pt In [facility] today  Discussed with pt there will be an out-of-pocket cost for prep and that varies from $0 to 70 dollars   Due to the COVID-19 pandemic we are asking patients to follow certain guidelines.  Pt aware of COVID protocols and [facility] guidelines

## 2021-07-06 ENCOUNTER — Encounter: Payer: Self-pay | Admitting: Certified Registered Nurse Anesthetist

## 2021-07-07 ENCOUNTER — Encounter: Payer: Self-pay | Admitting: Gastroenterology

## 2021-07-07 ENCOUNTER — Ambulatory Visit (AMBULATORY_SURGERY_CENTER): Payer: 59 | Admitting: Gastroenterology

## 2021-07-07 ENCOUNTER — Other Ambulatory Visit: Payer: Self-pay

## 2021-07-07 VITALS — BP 110/74 | HR 55 | Temp 96.8°F | Resp 14 | Ht 72.0 in | Wt 180.0 lb

## 2021-07-07 DIAGNOSIS — K635 Polyp of colon: Secondary | ICD-10-CM

## 2021-07-07 DIAGNOSIS — Z1211 Encounter for screening for malignant neoplasm of colon: Secondary | ICD-10-CM | POA: Diagnosis present

## 2021-07-07 DIAGNOSIS — D123 Benign neoplasm of transverse colon: Secondary | ICD-10-CM

## 2021-07-07 LAB — HM COLONOSCOPY

## 2021-07-07 MED ORDER — SODIUM CHLORIDE 0.9 % IV SOLN: 500.0000 mL | Freq: Once | INTRAVENOUS | Status: DC

## 2021-07-07 NOTE — Progress Notes (Signed)
Report given to PACU, vss 

## 2021-07-07 NOTE — Op Note (Signed)
[facility] Patient Name: Gary Garrett Procedure Date: 07/07/2021 9:09 AM MRN: [id number] Endoscopist: Ladene Artist , MD Age: 50 Referring MD:  Date of Birth: 28-Dec-1970 Gender: Male Account #: [id number] Procedure:                Colonoscopy Indications:              Screening for colorectal malignant neoplasm Medicines:                Monitored Anesthesia Care Procedure:                Pre-Anesthesia Assessment:                           - Prior to the procedure, a History and Physical                            was performed, and patient medications and                            allergies were reviewed. The patient's tolerance of                            previous anesthesia was also reviewed. The risks                            and benefits of the procedure and the sedation                            options and risks were discussed with the patient.                            All questions were answered, and informed consent                            was obtained. Prior Anticoagulants: The patient has                            taken no previous anticoagulant or antiplatelet                            agents. ASA Grade Assessment: II - A patient with                            mild systemic disease. After reviewing the risks                            and benefits, the patient was deemed in                            satisfactory condition to undergo the procedure.                           After obtaining informed consent, the colonoscope  was passed under direct vision. Throughout the                            procedure, the patient's blood pressure, pulse, and                            oxygen saturations were monitored continuously. The                            Colonoscope was introduced through the anus and                            advanced to the the cecum, identified by                            appendiceal orifice and  ileocecal valve. The                            ileocecal valve, appendiceal orifice, and rectum                            were photographed. The quality of the bowel                            preparation was excellent. The colonoscopy was                            performed without difficulty. The patient tolerated                            the procedure well. Scope In: 9:29:49 AM Scope Out: 9:48:56 AM Scope Withdrawal Time: 0 hours 16 minutes 16 seconds  Total Procedure Duration: 0 hours 19 minutes 7 seconds  Findings:                 The perianal and digital rectal examinations were                            normal.                           Three sessile polyps were found in the transverse                            colon (2) and hepatic flexure (1). The polyps were                            7 to 8 mm in size. These polyps were removed with a                            cold snare. Resection and retrieval were complete.                           Internal hemorrhoids were found during  retroflexion. The hemorrhoids were moderate and                            Grade I (internal hemorrhoids that do not prolapse).                           The exam was otherwise without abnormality on                            direct and retroflexion views. Complications:            No immediate complications. Estimated blood loss:                            None. Estimated Blood Loss:     Estimated blood loss: none. Impression:               - Three 7 to 8 mm polyps in the transverse colon                            and at the hepatic flexure, removed with a cold                            snare. Resected and retrieved.                           - Internal hemorrhoids.                           - The examination was otherwise normal on direct                            and retroflexion views. Recommendation:           - Repeat colonoscopy after studies are complete for                             surveillance based on pathology results.                           - Patient has a contact number available for                            emergencies. The signs and symptoms of potential                            delayed complications were discussed with the                            patient. Return to normal activities tomorrow.                            Written discharge instructions were provided to the                            patient.                           -  Resume previous diet.                           - Continue present medications.                           - Await pathology results. Ladene Artist, MD 07/07/2021 9:59:37 AM This report has been signed electronically.

## 2021-07-07 NOTE — Progress Notes (Signed)
Called to room to assist during endoscopic procedure.  Patient ID and intended procedure confirmed with present staff. Received instructions for my participation in the procedure from the performing physician.  

## 2021-07-07 NOTE — Patient Instructions (Signed)
YOU HAD AN ENDOSCOPIC PROCEDURE TODAY AT [facility]:   Refer to the procedure report that was given to you for any specific questions about what was found during the examination.  If the procedure report does not answer your questions, please call your gastroenterologist to clarify.  If you requested that your care partner not be given the details of your procedure findings, then the procedure report has been included in a sealed envelope for you to review at your convenience later.  Handouts provided on polyps and hemorrhoids.   YOU SHOULD EXPECT: Some feelings of bloating in the abdomen. Passage of more gas than usual.  Walking can help get rid of the air that was put into your GI tract during the procedure and reduce the bloating. If you had a lower endoscopy (such as a colonoscopy or flexible sigmoidoscopy) you may notice spotting of blood in your stool or on the toilet paper. If you underwent a bowel prep for your procedure, you may not have a normal bowel movement for a few days.  Please Note:  You might notice some irritation and congestion in your nose or some drainage.  This is from the oxygen used during your procedure.  There is no need for concern and it should clear up in a day or so.  SYMPTOMS TO REPORT IMMEDIATELY:  Following lower endoscopy (colonoscopy or flexible sigmoidoscopy):  Excessive amounts of blood in the stool  Significant tenderness or worsening of abdominal pains  Swelling of the abdomen that is new, acute  Fever of 100F or higher  For urgent or emergent issues, a gastroenterologist can be reached at any hour by calling [phone]. Do not use MyChart messaging for urgent concerns.    DIET:  We do recommend a small meal at first, but then you may proceed to your regular diet.  Drink plenty of fluids but you should avoid alcoholic beverages for 24 hours.  ACTIVITY:  You should plan to take it easy for the rest of today and you should NOT DRIVE  or use heavy machinery until tomorrow (because of the sedation medicines used during the test).    FOLLOW UP: Our staff will call the number listed on your records 48-72 hours following your procedure to check on you and address any questions or concerns that you may have regarding the information given to you following your procedure. If we do not reach you, we will leave a message.  We will attempt to reach you two times.  During this call, we will ask if you have developed any symptoms of COVID 19. If you develop any symptoms (ie: fever, flu-like symptoms, shortness of breath, cough etc.) before then, please call [phone].  If you test positive for Covid 19 in the 2 weeks post procedure, please call and report this information to [place].    If any biopsies were taken you will be contacted by phone or by letter within the next 1-3 weeks.  Please call us at [phone] if you have not heard about the biopsies in 3 weeks.    SIGNATURES/CONFIDENTIALITY: You and/or your care partner have signed paperwork which will be entered into your electronic medical record.  These signatures attest to the fact that that the information above on your After Visit Summary has been reviewed and is understood.  Full responsibility of the confidentiality of this discharge information lies with you and/or your care-partner.

## 2021-07-09 ENCOUNTER — Telehealth: Payer: Self-pay

## 2021-07-09 NOTE — Telephone Encounter (Signed)
  Follow up Call-  Call back number 07/07/2021  Post procedure Call Back phone  # [phone]  Permission to leave phone message Yes  Some recent data might be hidden     Patient questions:  Do you have a fever, pain , or abdominal swelling? No. Pain Score  0 *  Have you tolerated food without any problems? Yes.    Have you been able to return to your normal activities? Yes.    Do you have any questions about your discharge instructions: Diet   No. Medications  No. Follow up visit  No.  Do you have questions or concerns about your Care? No.  Actions: * If pain score is 4 or above: No action needed, pain <4. Have you developed a fever since your procedure? no  2.   Have you had an respiratory symptoms (SOB or cough) since your procedure? no  3.   Have you tested positive for COVID 19 since your procedure no  4.   Have you had any family members/close contacts diagnosed with the COVID 19 since your procedure?  no   If yes to any of these questions please route to Joylene John, RN and Joella Prince, RN

## 2021-07-15 ENCOUNTER — Encounter: Payer: Self-pay | Admitting: Gastroenterology

## 2021-08-22 ENCOUNTER — Telehealth: Payer: Self-pay | Admitting: Medical

## 2021-08-22 ENCOUNTER — Other Ambulatory Visit: Payer: Self-pay | Admitting: Medical

## 2021-08-22 MED ORDER — SCOPOLAMINE 1 MG/3DAYS TD PT72: 1.0000 | MEDICATED_PATCH | TRANSDERMAL | 1 refills | Status: DC

## 2021-08-22 NOTE — Telephone Encounter (Signed)
Leaving for cruise to [place] on Wednesday Cruise lasts 7 day, he would like to get patch again, like he did in January    [place]

## 2021-09-08 ENCOUNTER — Encounter: Payer: Self-pay | Admitting: Internal Medicine

## 2021-09-08 ENCOUNTER — Ambulatory Visit: Payer: 59 | Admitting: Medical

## 2021-09-08 ENCOUNTER — Other Ambulatory Visit: Payer: Self-pay

## 2021-09-08 VITALS — BP 120/82 | HR 84 | Temp 98.4°F | Wt 182.0 lb

## 2021-09-08 DIAGNOSIS — L089 Local infection of the skin and subcutaneous tissue, unspecified: Secondary | ICD-10-CM | POA: Diagnosis not present

## 2021-09-08 DIAGNOSIS — W57XXXA Bitten or stung by nonvenomous insect and other nonvenomous arthropods, initial encounter: Secondary | ICD-10-CM

## 2021-09-08 MED ORDER — MUPIROCIN 2 % EX OINT: 1.0000 "application " | TOPICAL_OINTMENT | Freq: Two times a day (BID) | CUTANEOUS | 0 refills | Status: DC

## 2021-09-08 MED ORDER — DOXYCYCLINE HYCLATE 100 MG PO TABS: 100.0000 mg | ORAL_TABLET | Freq: Two times a day (BID) | ORAL | 0 refills | Status: DC

## 2021-09-08 NOTE — Progress Notes (Signed)
Subjective:  Gary Garrett is a 50 y.o. male who presents for Chief Complaint  Patient presents with   Insect Bite    Swelling of the ankle. Got bit Saturday Night. Kept ice yesterday. Hard to walk. Throbbing. Warm to touch.      Here for bug bite on left lower leg infected.   Just got back from [place] vacation.  Noticed a red mark on left lower leg, thinks he got bit by mosquito in airport.   However, quickly the left lower leg is warm and swollen, feels achy.  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    He wants flu shot and is due for 2nd Monkey Pox vaccine this week, but thinks he should wait given the skin infection.  No other aggravating or relieving factors.    No other c/o.   The following portions of the patient's history were reviewed and updated as appropriate: allergies, current medications, past family history, past medical history, past social history, past surgical history and problem list.  ROS Otherwise as in subjective above    Objective: BP 120/82   Pulse 84   Temp 98.4 F (36.9 C)   Wt 182 lb (82.6 kg)   BMI 24.68 kg/m   General appearance: alert, no distress, well developed, well nourished Skin: left lower anterior leg with 8mm diameter papular erythematous lesion with some local pink/red coloration, warmth and localized swelling, otherwise leg nontender, and no other deformity    Assessment: Encounter Diagnoses  Name Primary?   Bug bite, initial encounter Yes   Skin infection      Plan: Given findings, will treat for possible MRSA skin infection, early.  Begin oral doxycycline, begin topical mupirocin and use intranasal mupirocin as well given his prior hx/o MRSA as well.  Coinsure warm compresses, leg elevation.  If not much improved in the next 3-5 days, then recheck.  He will delay flu shot and 2nd monkey pox vaccine until this skin lesion resolves   [name] was seen today for insect bite.  Diagnoses and all orders for  this visit:  Bug bite, initial encounter  Skin infection  Other orders -     doxycycline (VIBRA-TABS) 100 MG tablet; Take 1 tablet (100 mg total) by mouth 2 (two) times daily. -     mupirocin ointment (BACTROBAN) 2 %; Apply 1 application topically 2 (two) times daily.   Follow up: prn

## 2021-11-12 DIAGNOSIS — G4700 Insomnia, unspecified: Secondary | ICD-10-CM

## 2021-11-27 NOTE — Telephone Encounter (Signed)
Gary Garrett,  I have put in a referral for pt for sleep studies. This is for alternative for cpap machine. Patient has listed the information below

## 2021-12-02 ENCOUNTER — Telehealth: Payer: Self-pay | Admitting: Medical

## 2021-12-02 NOTE — Telephone Encounter (Signed)
Pt has virtual on thursday

## 2021-12-02 NOTE — Telephone Encounter (Signed)
I received a order form from dentist Dr. Ron Parker.  The form is requiring office notes documenting that we discussed his intolerance of CPAP.  Please set up a brief appointment to discuss.  This can be virtual or in person to complete form

## 2021-12-04 ENCOUNTER — Telehealth: Payer: 59 | Admitting: Medical

## 2021-12-04 ENCOUNTER — Encounter: Payer: Self-pay | Admitting: Medical

## 2021-12-04 ENCOUNTER — Other Ambulatory Visit: Payer: Self-pay

## 2021-12-04 VITALS — Wt 179.2 lb

## 2021-12-04 DIAGNOSIS — G4733 Obstructive sleep apnea (adult) (pediatric): Secondary | ICD-10-CM | POA: Diagnosis not present

## 2021-12-04 DIAGNOSIS — Z789 Other specified health status: Secondary | ICD-10-CM

## 2021-12-04 NOTE — Progress Notes (Signed)
Subjective:     Patient ID: Gary Garrett, male   DOB: 15-May-1971, 50 y.o.   MRN: [id number]  This visit type was conducted due to national recommendations for restrictions regarding the COVID-19 Pandemic (e.g. social distancing) in an effort to limit this patient's exposure and mitigate transmission in our community.  Due to their co-morbid illnesses, this patient is at least at moderate risk for complications without adequate follow up.  This format is felt to be most appropriate for this patient at this time.    Documentation for virtual audio and video telecommunications through [place] encounter:  The patient was located at home. The provider was located in the office. The patient did consent to this visit and is aware of possible charges through their insurance for this visit.  The other persons participating in this telemedicine service were none. Time spent on call was 20 minutes and in review of previous records 20 minutes total.  This virtual service is not related to other E/M service within previous 7 days.   HPI Chief Complaint  Patient presents with   discuss oral appliance    Discuss oral appliance and needs form completed   Virtual consult for oral appliance for sleep apnea.  He had an abnormal sleep study back in 2015 showing sleep apnea.  In 2015-2016 he had tried a few different CPAP masks and just couldn't tolerate any.   He notes severe claustrophobia contributing to his inability to use CPAP.  He lost weight right after the OSA diagnosis.  However he notes in recent years having some fatigue and friends have noted witnessed gasping in his sleep.  No other aggravating or relieving factors. No other complaint.   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     No current facility-administered medications on file prior to visit.     Review of Systems As in subjective    Objective:   Physical Exam Due to coronavirus pandemic stay at home measures, patient visit was virtual and they were not examined in person.   Wt 179 lb 3.2 oz (81.3 kg)    BMI 24.30 kg/m   Wt Readings from Last 3 Encounters:  12/04/21 179 lb 3.2 oz (81.3 kg)  09/08/21 182 lb (82.6 kg)  07/07/21 180 lb (81.6 kg)   Gen: wd, wn, nad      Assessment:     Encounter Diagnoses  Name Primary?   OSA (obstructive sleep apnea) Yes   CPAP ventilation treatment not tolerated        Plan:     History of severe sleep apnea.  I reviewed back over the 2015 sleep study report.  He prior failed CPAP therapy, different mask.  He is likely not going to be able to tolerate CPAP going forward.  We discussed elevation of head of bed, not sleeping supine, avoid sedative type medications or alcohol close to  bedtime.  Referral to dentist that he requested for oral airway device for sleep apnea treatment  He may also consider Inspire procedure in the future but for now wants to try oral airway device  Blessed was seen today for discuss oral appliance.  Diagnoses and all orders for this visit:  OSA (obstructive sleep apnea)  CPAP ventilation treatment not tolerated  F/u pending treatment

## 2022-01-08 ENCOUNTER — Ambulatory Visit: Payer: 59 | Admitting: Medical

## 2022-01-08 ENCOUNTER — Encounter: Payer: Self-pay | Admitting: Medical

## 2022-01-08 ENCOUNTER — Other Ambulatory Visit: Payer: Self-pay

## 2022-01-08 VITALS — BP 120/80 | HR 75 | Ht 71.5 in | Wt 188.2 lb

## 2022-01-08 DIAGNOSIS — Z125 Encounter for screening for malignant neoplasm of prostate: Secondary | ICD-10-CM

## 2022-01-08 DIAGNOSIS — Z0000 Encounter for general adult medical examination without abnormal findings: Secondary | ICD-10-CM | POA: Diagnosis not present

## 2022-01-08 DIAGNOSIS — Z87898 Personal history of other specified conditions: Secondary | ICD-10-CM

## 2022-01-08 DIAGNOSIS — G4730 Sleep apnea, unspecified: Secondary | ICD-10-CM

## 2022-01-08 DIAGNOSIS — Z87442 Personal history of urinary calculi: Secondary | ICD-10-CM | POA: Diagnosis not present

## 2022-01-08 DIAGNOSIS — Z13220 Encounter for screening for lipoid disorders: Secondary | ICD-10-CM | POA: Diagnosis not present

## 2022-01-08 DIAGNOSIS — G4700 Insomnia, unspecified: Secondary | ICD-10-CM

## 2022-01-08 DIAGNOSIS — Z131 Encounter for screening for diabetes mellitus: Secondary | ICD-10-CM

## 2022-01-08 DIAGNOSIS — Z113 Encounter for screening for infections with a predominantly sexual mode of transmission: Secondary | ICD-10-CM

## 2022-01-08 LAB — POCT URINALYSIS DIP (PROADVANTAGE DEVICE)
Bilirubin, UA: NEGATIVE
Blood, UA: NEGATIVE
Glucose, UA: NEGATIVE mg/dL
Ketones, POC UA: NEGATIVE mg/dL
Leukocytes, UA: NEGATIVE
Nitrite, UA: NEGATIVE
Protein Ur, POC: NEGATIVE mg/dL
Specific Gravity, Urine: 1.015
Urobilinogen, Ur: NEGATIVE
pH, UA: 6 (ref 5.0–8.0)

## 2022-01-08 MED ORDER — SCOPOLAMINE 1 MG/3DAYS TD PT72: 1.0000 | MEDICATED_PATCH | TRANSDERMAL | 1 refills | Status: DC

## 2022-01-08 NOTE — Progress Notes (Signed)
Subjective:   HPI  Gary Garrett is a 51 y.o. male who presents for Chief Complaint  Patient presents with   fasting cpe    Fasting cpe, no concerns. Would like a patch for traveling    Patient Care Team: Lauryl Seyer, Leward Quan as PCP - General (Family Medicine) Sees dentist Sees eye doctor [facility] Dermatology Dr. Lucio Edward, GI Dr. Ron Parker, oral surgery   Concerns: Been in usual state of health.  However last week he was pulling a towel off of his towel rack and popped himself in the Left eye causing some redness.  Its resolved now  Going on cruise next week to [place] and [place].  Needs motion sickness patch.     Reviewed their medical, surgical, family, social, medication, and allergy history and updated chart as appropriate.  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   Former smoker    prior occasional smoker   GAD (generalized anxiety disorder)    Sleep apnea 02/2014   resolved with weight loss, never tolerated CPAP either    Past Surgical History:  Procedure Laterality Date   SKIN LESION EXCISION     WISDOM TOOTH EXTRACTION      Family History  Problem Relation Age of Onset   Cancer Mother        breast cancer   Asthma Sister    Fibromyalgia Sister    Stroke Maternal Grandmother    Cancer Maternal Grandfather 35       prostate   Hypertension Paternal Grandmother    Heart disease Paternal Grandfather        died in 12s of heart disease   Cancer Other        great grandmother lung cancer   Diabetes Neg Hx    Colon cancer Neg Hx    Colon polyps Neg Hx    Esophageal cancer Neg Hx    Rectal cancer Neg Hx    Stomach cancer Neg Hx      Current Outpatient Medications:    scopolamine (TRANSDERM-SCOP) 1 MG/3DAYS, Place 1 patch (1.5 mg total) onto the skin every 3 (three) days.,  Disp: 4 patch, Rfl: 1   diphenhydrAMINE (BENADRYL) 25 mg capsule, Take 25 mg by mouth every 6 (six) hours as needed. (Patient not taking: Reported on 01/08/2022), Disp: , Rfl:   No Known Allergies    Review of Systems Constitutional: -fever, -chills, -sweats, -unexpected weight change, -decreased appetite, -fatigue Allergy: -sneezing, -itching, -congestion Dermatology: -changing moles, --rash, -lumps ENT: -runny nose, -ear pain, -sore throat, -hoarseness, -sinus pain, -teeth pain, - ringing in ears, -hearing loss, -nosebleeds Cardiology: -chest pain, -palpitations, -swelling, -difficulty breathing when lying flat, -waking up short of breath Respiratory: -cough, -shortness of breath, -difficulty breathing with exercise or exertion, -wheezing, -coughing up blood Gastroenterology: -abdominal pain, -nausea, -vomiting, -diarrhea, -constipation, -blood in stool, -changes in bowel movement, -difficulty swallowing or eating Hematology: -bleeding, -bruising  Musculoskeletal: -joint aches, -muscle aches, -joint swelling, -back pain, -neck pain, -cramping, -changes in gait Ophthalmology: denies vision changes, +eye redness, itching, discharge Urology: -burning with urination, -difficulty urinating, -blood in urine, -urinary frequency, -urgency, -incontinence Neurology: -headache, -weakness, -tingling, -numbness, -memory loss, -falls, -dizziness Psychology: -depressed mood, -agitation, +sleep problems Male GU: no testicular mass, pain, no lymph nodes swollen, no swelling,  no rash.      Objective:  BP 120/80    Pulse 75    Ht 5' 11.5" (1.816 m)    Wt 188 lb 3.2 oz (85.4 kg)    BMI 25.88 kg/m   Wt Readings from Last 3 Encounters:  01/08/22 188 lb 3.2 oz (85.4 kg)  12/04/21 179 lb 3.2 oz (81.3 kg)  09/08/21 182 lb (82.6 kg)    General appearance: alert, no distress, WD/WN, Caucasian male Skin: no worrisome lesions HEENT: normocephalic, conjunctiva/corneas normal, sclerae anicteric, PERRLA, EOMi,  nares patent, no discharge or erythema, pharynx normal Oral cavity: MMM, tongue normal, teeth in good repair Neck: supple, no lymphadenopathy, no thyromegaly, no masses, normal ROM, no bruits Chest: non tender, normal shape and expansion Heart: RRR, normal S1, S2, no murmurs Lungs: CTA bilaterally, no wheezes, rhonchi, or rales Abdomen: +bs, soft, non tender, non distended, no masses, no hepatomegaly, no splenomegaly, no bruits Back: non tender, normal ROM, no scoliosis Musculoskeletal: upper extremities non tender, no obvious deformity, normal ROM throughout, lower extremities non tender, no obvious deformity, normal ROM throughout Extremities: no edema, no cyanosis, no clubbing Pulses: 2+ symmetric, upper and lower extremities, normal cap refill Neurological: alert, oriented x 3, CN2-12 intact, strength normal upper extremities and lower extremities, sensation normal throughout, DTRs 2+ throughout, no cerebellar signs, gait normal Psychiatric: normal affect, behavior normal, pleasant  GU/rectal - deferred to urology   Assessment and Plan :   Encounter Diagnoses  Name Primary?   Routine general medical examination at a health care facility Yes   Screening for lipid disorders    Screening for prostate cancer    History of renal stone    Insomnia, unspecified type    Sleep apnea, unspecified type    Screen for STD (sexually transmitted disease)    Screening for diabetes mellitus    Hx of motion sickness     Today you had a preventative care visit or wellness visit.    Topics today may have included healthy lifestyle, diet, exercise, preventative care, vaccinations, sick and well care, proper use of emergency dept and after hours care, as well as other concerns.     Recommendations: Continue to return yearly for your annual wellness and preventative care visits.  This gives [place] a chance to discuss healthy lifestyle, exercise, vaccinations, review your chart record, and perform  screenings where appropriate.  I recommend you see your eye doctor yearly for routine vision care.  I recommend you see your dentist yearly for routine dental care including hygiene visits twice yearly.   Vaccination recommendations were reviewed Immunization History  Administered Date(s) Administered   Hepatitis A 06/27/2002, 12/28/2002   Hepatitis B 06/27/2002, 07/25/2002, 12/28/2002   Influenza Inj Mdck Quad Pf 09/06/2019   Influenza Split 09/30/2011, 09/13/2021   Influenza,inj,Quad PF,6+ Mos 11/20/2013, 12/03/2014, 11/25/2015, 09/23/2016, 10/21/2017, 10/24/2018   PFIZER(Purple Top)SARS-COV-2 Vaccination 03/25/2020, 04/15/2020, 10/24/2020   Tdap 12/03/2014   Zoster Recombinat (Shingrix) 01/13/2021, 04/09/2021   He notes having monkey pox vaccine at health dept this past year 2022.   Screening for cancer: Colon cancer screening:  Reviewed colonoscopy Dr. Lucio Edward 07/07/21, polyps, due repeat in 3 years  We discussed PSA, prostate exam, and prostate cancer screening risks/benefits.     Skin cancer screening: Check your skin regularly for new changes, growing lesions, or other lesions of concern Come in for evaluation if you have skin lesions of concern.  Lung cancer screening: If you have a greater than 30 pack year  history of tobacco use, then you qualify for lung cancer screening with a chest CT scan  We currently don't have screenings for other cancers besides breast, cervical, colon, and lung cancers.  If you have a strong family history of cancer or have other cancer screening concerns, please let me know.    Bone health: Get at least 150 minutes of aerobic exercise weekly Get weight bearing exercise at least once weekly   Heart health: Get at least 150 minutes of aerobic exercise weekly Limit alcohol It is important to maintain a healthy blood pressure and healthy cholesterol numbers   Significat other issues: Sleep apnea - prior intolerance to CPAP.   Referral to ENT for consult.  He has also talked to dentist about mouthpiece option  Hx/o motion sickness - refilled scopolamine patch.   He has used this prior with success  Rydge was seen today for fasting cpe.  Diagnoses and all orders for this visit:  Routine general medical examination at a health care facility -     Comprehensive metabolic panel -     CBC -     Lipid panel -     PSA -     Hemoglobin A1c -     RPR+HIV+GC+CT Panel -     Ambulatory referral to ENT -     POCT Urinalysis DIP (Proadvantage Device)  Screening for lipid disorders -     Lipid panel  Screening for prostate cancer -     PSA  History of renal stone  Insomnia, unspecified type  Sleep apnea, unspecified type  Screen for STD (sexually transmitted disease) -     RPR+HIV+GC+CT Panel  Screening for diabetes mellitus -     Hemoglobin A1c  Hx of motion sickness  Other orders -     scopolamine (TRANSDERM-SCOP) 1 MG/3DAYS; Place 1 patch (1.5 mg total) onto the skin every 3 (three) days.    Follow-up pending labs, yearly for physical

## 2022-01-09 LAB — CBC
Hematocrit: 45 % (ref 37.5–51.0)
Hemoglobin: 15.2 g/dL (ref 13.0–17.7)
MCH: 29.5 pg (ref 26.6–33.0)
MCHC: 33.8 g/dL (ref 31.5–35.7)
MCV: 87 fL (ref 79–97)
Platelets: 298 10*3/uL (ref 150–450)
RBC: 5.15 x10E6/uL (ref 4.14–5.80)
RDW: 13 % (ref 11.6–15.4)
WBC: 3.7 10*3/uL (ref 3.4–10.8)

## 2022-01-09 LAB — COMPREHENSIVE METABOLIC PANEL
ALT: 31 IU/L (ref 0–44)
AST: 19 IU/L (ref 0–40)
Albumin/Globulin Ratio: 2.2 (ref 1.2–2.2)
Albumin: 4.8 g/dL (ref 3.8–4.9)
Alkaline Phosphatase: 55 IU/L (ref 44–121)
BUN/Creatinine Ratio: 18 (ref 9–20)
BUN: 17 mg/dL (ref 6–24)
Bilirubin Total: 0.2 mg/dL (ref 0.0–1.2)
CO2: 26 mmol/L (ref 20–29)
Calcium: 9.3 mg/dL (ref 8.7–10.2)
Chloride: 103 mmol/L (ref 96–106)
Creatinine, Ser: 0.94 mg/dL (ref 0.76–1.27)
Globulin, Total: 2.2 g/dL (ref 1.5–4.5)
Glucose: 98 mg/dL (ref 70–99)
Potassium: 4.6 mmol/L (ref 3.5–5.2)
Sodium: 142 mmol/L (ref 134–144)
Total Protein: 7 g/dL (ref 6.0–8.5)
eGFR: 98 mL/min/{1.73_m2} (ref >59)

## 2022-01-09 LAB — LIPID PANEL
Chol/HDL Ratio: 4.8 ratio (ref 0.0–5.0)
Cholesterol, Total: 184 mg/dL (ref 100–199)
HDL: 38 mg/dL — ABNORMAL LOW (ref >39)
LDL Chol Calc (NIH): 134 mg/dL — ABNORMAL HIGH (ref 0–99)
Triglycerides: 61 mg/dL (ref 0–149)
VLDL Cholesterol Cal: 12 mg/dL (ref 5–40)

## 2022-01-09 LAB — RPR+HIV+GC+CT PANEL
Chlamydia trachomatis, NAA: NEGATIVE
HIV Screen 4th Generation wRfx: NONREACTIVE
Neisseria Gonorrhoeae by PCR: NEGATIVE
RPR Ser Ql: REACTIVE — AB

## 2022-01-09 LAB — HEMOGLOBIN A1C
Est. average glucose Bld gHb Est-mCnc: 114 mg/dL
Hgb A1c MFr Bld: 5.6 % (ref 4.8–5.6)

## 2022-01-09 LAB — RPR, QUANT. (REFLEX): Rapid Plasma Reagin, Quant: 1:1 {titer} — ABNORMAL HIGH

## 2022-01-09 LAB — PSA: Prostate Specific Ag, Serum: 0.7 ng/mL (ref 0.0–4.0)

## 2022-01-14 ENCOUNTER — Other Ambulatory Visit: Payer: Self-pay | Admitting: Medical

## 2022-01-14 DIAGNOSIS — A530 Latent syphilis, unspecified as early or late: Secondary | ICD-10-CM

## 2022-01-15 ENCOUNTER — Telehealth: Payer: Self-pay | Admitting: Medical

## 2022-01-15 LAB — SPECIMEN STATUS REPORT

## 2022-01-15 LAB — T.PALLIDUM AB, TOTAL: Treponema pallidum Antibodies: NONREACTIVE

## 2022-01-15 NOTE — Telephone Encounter (Signed)
I received a call yesterday January 14, 2022 from Norm Salt from the state health department, call back [phone].  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  He wanted to make sure we added on treponema antibody.  I have added this on at the lab per our lab personnel Verdis Frederickson

## 2022-08-19 ENCOUNTER — Encounter: Payer: Self-pay | Admitting: Internal Medicine

## 2022-09-01 ENCOUNTER — Encounter: Payer: Self-pay | Admitting: Internal Medicine

## 2022-09-01 LAB — HM DIABETES EYE EXAM

## 2022-09-22 ENCOUNTER — Encounter: Payer: Self-pay | Admitting: Internal Medicine

## 2022-11-02 LAB — HM DIABETES EYE EXAM

## 2022-11-03 ENCOUNTER — Encounter: Payer: Self-pay | Admitting: Internal Medicine

## 2023-01-07 LAB — HM DIABETES EYE EXAM

## 2023-01-11 ENCOUNTER — Ambulatory Visit: Payer: 59 | Admitting: Medical

## 2023-01-11 ENCOUNTER — Encounter: Payer: Self-pay | Admitting: Medical

## 2023-01-11 VITALS — BP 120/80 | HR 74 | Ht 73.0 in | Wt 187.6 lb

## 2023-01-11 DIAGNOSIS — H00012 Hordeolum externum right lower eyelid: Secondary | ICD-10-CM | POA: Insufficient documentation

## 2023-01-11 DIAGNOSIS — Z13220 Encounter for screening for lipoid disorders: Secondary | ICD-10-CM | POA: Diagnosis not present

## 2023-01-11 DIAGNOSIS — Z113 Encounter for screening for infections with a predominantly sexual mode of transmission: Secondary | ICD-10-CM | POA: Diagnosis not present

## 2023-01-11 DIAGNOSIS — Z87442 Personal history of urinary calculi: Secondary | ICD-10-CM

## 2023-01-11 DIAGNOSIS — Z136 Encounter for screening for cardiovascular disorders: Secondary | ICD-10-CM | POA: Insufficient documentation

## 2023-01-11 DIAGNOSIS — J4520 Mild intermittent asthma, uncomplicated: Secondary | ICD-10-CM

## 2023-01-11 DIAGNOSIS — Z0000 Encounter for general adult medical examination without abnormal findings: Secondary | ICD-10-CM

## 2023-01-11 DIAGNOSIS — Z125 Encounter for screening for malignant neoplasm of prostate: Secondary | ICD-10-CM | POA: Diagnosis not present

## 2023-01-11 DIAGNOSIS — J45909 Unspecified asthma, uncomplicated: Secondary | ICD-10-CM | POA: Insufficient documentation

## 2023-01-11 LAB — POCT URINALYSIS DIP (PROADVANTAGE DEVICE)
Bilirubin, UA: NEGATIVE
Blood, UA: NEGATIVE
Glucose, UA: NEGATIVE mg/dL
Ketones, POC UA: NEGATIVE mg/dL
Leukocytes, UA: NEGATIVE
Nitrite, UA: NEGATIVE
Protein Ur, POC: NEGATIVE mg/dL
Specific Gravity, Urine: 1.01
Urobilinogen, Ur: 0.2
pH, UA: 6 (ref 5.0–8.0)

## 2023-01-11 LAB — LIPID PANEL

## 2023-01-11 NOTE — Progress Notes (Signed)
Subjective:   HPI  Gary Garrett is a 52 y.o. male who presents for Chief Complaint  Patient presents with   fasting cpe    Fasting cpe,     Patient Care Team: Clorine Swing, Leward Quan as PCP - General (Family Medicine) Sees dentist Sees eye doctor [facility] Dermatology Dr. Lucio Edward, GI Dr. Ron Parker, oral surgery Prior Urology   Concerns: Been in usual state of health.   Reviewed their medical, surgical, family, social, medication, and allergy history and updated chart as appropriate.  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 Lucio Edward   SKIN LESION EXCISION     WISDOM TOOTH EXTRACTION      Family History  Problem Relation Age of Onset   Cancer Mother        breast cancer   Asthma Sister    Fibromyalgia Sister    Stroke Maternal Grandmother    Cancer Maternal Grandfather 79       prostate   Hypertension Paternal Grandmother    Heart disease Paternal Grandfather        died in 7s of heart disease   Cancer Other        great grandmother lung cancer   Diabetes Neg Hx    Colon cancer Neg Hx    Colon polyps Neg Hx    Esophageal cancer Neg Hx    Rectal cancer Neg Hx    Stomach cancer Neg Hx      Current Outpatient Medications:    diphenhydrAMINE (BENADRYL) 25 mg capsule, Take 25 mg by mouth every 6 (six) hours as needed., Disp: , Rfl:    doxycycline (ADOXA) 100 MG tablet, Take 100 mg by mouth 2 (two) times daily., Disp: , Rfl:    MAXITROL 3.5-10000-0.1 OINT, Place 1 Application into the right eye 3 (three) times daily., Disp: ,  Rfl:   No Known Allergies   Review of Systems  Constitutional:  Negative for chills, fever, malaise/fatigue and weight loss.  HENT:  Negative for congestion, ear pain, hearing loss, sore throat and tinnitus.   Eyes:  Negative for blurred vision, pain and redness.  Respiratory:  Negative for cough, hemoptysis and shortness of breath.   Cardiovascular:  Negative for chest pain, palpitations, orthopnea, claudication and leg swelling.  Gastrointestinal:  Negative for abdominal pain, blood in stool, constipation, diarrhea, nausea and vomiting.  Genitourinary:  Negative for dysuria, flank pain, frequency, hematuria and urgency.  Musculoskeletal:  Negative for falls, joint pain and myalgias.  Skin:  Negative for itching and rash.  Neurological:  Negative for dizziness, tingling, speech change, weakness and headaches.  Endo/Heme/Allergies:  Negative for polydipsia. Does not bruise/bleed easily.  Psychiatric/Behavioral:  Negative for depression and memory loss. The patient is not nervous/anxious and does not have insomnia.          Objective:  BP 120/80   Pulse 74   Ht '6\' 1"'$  (1.854 m)   Wt 187 lb 9.6 oz (85.1 kg)  BMI 24.75 kg/m   Wt Readings from Last 3 Encounters:  01/11/23 187 lb 9.6 oz (85.1 kg)  01/08/22 188 lb 3.2 oz (85.4 kg)  12/04/21 179 lb 3.2 oz (81.3 kg)    General appearance: alert, no distress, WD/WN, Caucasian male Skin: no worrisome lesions HEENT: normocephalic, conjunctiva/corneas normal, sclerae anicteric, PERRLA, EOMi, nares patent, no discharge or erythema, pharynx normal Oral cavity: MMM, tongue normal, teeth in good repair Neck: supple, no lymphadenopathy, no thyromegaly, no masses, normal ROM, no bruits Chest: non tender, normal shape and expansion Heart: RRR, normal S1, S2, no murmurs Lungs: CTA bilaterally, no wheezes, rhonchi, or rales Abdomen: +bs, soft, non tender, non distended, no masses, no hepatomegaly, no splenomegaly, no bruits Back: non tender,  normal ROM, no scoliosis Musculoskeletal: upper extremities non tender, no obvious deformity, normal ROM throughout, lower extremities non tender, no obvious deformity, normal ROM throughout Extremities: no edema, no cyanosis, no clubbing Pulses: 2+ symmetric, upper and lower extremities, normal cap refill Neurological: alert, oriented x 3, CN2-12 intact, strength normal upper extremities and lower extremities, sensation normal throughout, DTRs 2+ throughout, no cerebellar signs, gait normal Psychiatric: normal affect, behavior normal, pleasant  GU -normal male, circ, no lesions, no hernia rectal - deferred to urology   Assessment and Plan :   Encounter Diagnoses  Name Primary?   Routine general medical examination at a health care facility Yes   Screen for STD (sexually transmitted disease)    Screening for lipid disorders    Screening for prostate cancer    History of renal stone    Screening for heart disease    Mild intermittent asthma, unspecified whether complicated    Hordeolum externum of right lower eyelid      Today you had a preventative care visit or wellness visit.    Topics today may have included healthy lifestyle, diet, exercise, preventative care, vaccinations, sick and well care, proper use of emergency dept and after hours care, as well as other concerns.     Recommendations: Continue to return yearly for your annual wellness and preventative care visits.  This gives [place] a chance to discuss healthy lifestyle, exercise, vaccinations, review your chart record, and perform screenings where appropriate.  I recommend you see your eye doctor yearly for routine vision care.  I recommend you see your dentist yearly for routine dental care including hygiene visits twice yearly.   Vaccination recommendations were reviewed Immunization History  Administered Date(s) Administered   COVID-19, mRNA, vaccine(Comirnaty)12 years and older 09/16/2022   Hepatitis A 06/27/2002,  12/28/2002   Hepatitis B 06/27/2002, 07/25/2002, 12/28/2002   Influenza Inj Mdck Quad Pf 09/06/2019   Influenza Split 09/30/2011, 09/13/2021   Influenza,inj,Quad PF,6+ Mos 11/20/2013, 12/03/2014, 11/25/2015, 09/23/2016, 10/21/2017, 10/24/2018   Influenza-Unspecified 09/16/2022   PFIZER(Purple Top)SARS-COV-2 Vaccination 03/25/2020, 04/15/2020, 10/24/2020   Tdap 12/03/2014   Zoster Recombinat (Shingrix) 01/13/2021, 04/09/2021    Screening for cancer: Colon cancer screening:  Reviewed colonoscopy Dr. Lucio Edward 07/07/21, polyps, due repeat in 3 years  We discussed PSA, prostate exam, and prostate cancer screening risks/benefits.     Skin cancer screening: Check your skin regularly for new changes, growing lesions, or other lesions of concern Come in for evaluation if you have skin lesions of concern.  Lung cancer screening: If you have a greater than 30 pack year history of tobacco use, then you qualify for lung cancer screening with a chest CT scan  We currently don't have screenings for other cancers besides breast, cervical, colon,  and lung cancers.  If you have a strong family history of cancer or have other cancer screening concerns, please let me know.    Bone health: Get at least 150 minutes of aerobic exercise weekly Get weight bearing exercise at least once weekly   Heart health: Get at least 150 minutes of aerobic exercise weekly Limit alcohol It is important to maintain a healthy blood pressure and healthy cholesterol numbers   Significant other issues: Asthma - no recent problems  Stye - seeing eye doctor, on therapy currently   Derward was seen today for fasting cpe.  Diagnoses and all orders for this visit:  Routine general medical examination at a health care facility -     Comprehensive metabolic panel -     CBC -     PSA -     Lipid panel -     POCT Urinalysis DIP (Proadvantage Device) -     RPR+HIV+GC+CT Panel -     CT CARDIAC SCORING (SELF PAY  ONLY); Future  Screen for STD (sexually transmitted disease) -     RPR+HIV+GC+CT Panel  Screening for lipid disorders -     Lipid panel  Screening for prostate cancer -     PSA  History of renal stone  Screening for heart disease -     CT CARDIAC SCORING (SELF PAY ONLY); Future  Mild intermittent asthma, unspecified whether complicated  Hordeolum externum of right lower eyelid   Follow-up pending labs, yearly for physical

## 2023-01-12 NOTE — Progress Notes (Signed)
Results sent through MyChart

## 2023-01-13 ENCOUNTER — Encounter: Payer: Self-pay | Admitting: Internal Medicine

## 2023-01-13 LAB — COMPREHENSIVE METABOLIC PANEL
ALT: 16 IU/L (ref 0–44)
AST: 16 IU/L (ref 0–40)
Albumin/Globulin Ratio: 1.9 (ref 1.2–2.2)
Albumin: 4.8 g/dL (ref 3.8–4.9)
Alkaline Phosphatase: 52 IU/L (ref 44–121)
BUN/Creatinine Ratio: 17 (ref 9–20)
BUN: 15 mg/dL (ref 6–24)
Bilirubin Total: 0.3 mg/dL (ref 0.0–1.2)
CO2: 22 mmol/L (ref 20–29)
Calcium: 9.7 mg/dL (ref 8.7–10.2)
Chloride: 102 mmol/L (ref 96–106)
Creatinine, Ser: 0.9 mg/dL (ref 0.76–1.27)
Globulin, Total: 2.5 g/dL (ref 1.5–4.5)
Glucose: 97 mg/dL (ref 70–99)
Potassium: 4.2 mmol/L (ref 3.5–5.2)
Sodium: 139 mmol/L (ref 134–144)
Total Protein: 7.3 g/dL (ref 6.0–8.5)
eGFR: 103 mL/min/{1.73_m2} (ref >59)

## 2023-01-13 LAB — CBC
Hematocrit: 45.3 % (ref 37.5–51.0)
Hemoglobin: 15.2 g/dL (ref 13.0–17.7)
MCH: 29.2 pg (ref 26.6–33.0)
MCHC: 33.6 g/dL (ref 31.5–35.7)
MCV: 87 fL (ref 79–97)
Platelets: 254 10*3/uL (ref 150–450)
RBC: 5.21 x10E6/uL (ref 4.14–5.80)
RDW: 13.5 % (ref 11.6–15.4)
WBC: 4 10*3/uL (ref 3.4–10.8)

## 2023-01-13 LAB — RPR+HIV+GC+CT PANEL
Chlamydia trachomatis, NAA: NEGATIVE
HIV Screen 4th Generation wRfx: NONREACTIVE
Neisseria Gonorrhoeae by PCR: NEGATIVE
RPR Ser Ql: NONREACTIVE

## 2023-01-13 LAB — LIPID PANEL
Chol/HDL Ratio: 5.6 ratio — ABNORMAL HIGH (ref 0.0–5.0)
Cholesterol, Total: 228 mg/dL — ABNORMAL HIGH (ref 100–199)
HDL: 41 mg/dL (ref >39)
LDL Chol Calc (NIH): 174 mg/dL — ABNORMAL HIGH (ref 0–99)
Triglycerides: 73 mg/dL (ref 0–149)
VLDL Cholesterol Cal: 13 mg/dL (ref 5–40)

## 2023-01-13 LAB — PSA: Prostate Specific Ag, Serum: 0.6 ng/mL (ref 0.0–4.0)

## 2023-01-14 NOTE — Progress Notes (Signed)
Results sent through MyChart

## 2023-01-20 ENCOUNTER — Ambulatory Visit (HOSPITAL_COMMUNITY)
Admission: RE | Admit: 2023-01-20 | Discharge: 2023-01-20 | Disposition: A | Discharge status: Home / Self Care | Payer: Self-pay | Source: Ambulatory Visit | Attending: Medical | Admitting: Medical

## 2023-01-20 DIAGNOSIS — Z0000 Encounter for general adult medical examination without abnormal findings: Secondary | ICD-10-CM | POA: Insufficient documentation

## 2023-01-20 DIAGNOSIS — Z136 Encounter for screening for cardiovascular disorders: Secondary | ICD-10-CM | POA: Insufficient documentation

## 2023-01-20 NOTE — Progress Notes (Signed)
Results sent through MyChart

## 2023-01-22 NOTE — Progress Notes (Signed)
Call patient about results as I do not think he has looked at the [place] yet.  See if he is agreeable to the recommendations I have

## 2023-01-22 NOTE — Progress Notes (Signed)
Results sent through MyChart

## 2023-05-13 ENCOUNTER — Ambulatory Visit: Payer: No Typology Code available for payment source | Admitting: Family Medicine

## 2023-05-13 ENCOUNTER — Encounter: Payer: Self-pay | Admitting: Family Medicine

## 2023-05-13 VITALS — BP 120/70 | HR 72 | Temp 97.7°F | Ht 73.0 in | Wt 195.8 lb

## 2023-05-13 DIAGNOSIS — J302 Other seasonal allergic rhinitis: Secondary | ICD-10-CM

## 2023-05-13 NOTE — Patient Instructions (Signed)
  Stay well hydrated. I don't see evidence of a bacterial infection at this point.  Please contact us if you develop worsening sinus pain, persistent discolored mucus (not just first thing in the morning).  I recommend taking claritin AND sudafed.  I suggest switching to a 24 hour Claritin-D, as this contains pseudoephedrine as the decongestant, which is more effective than the phenylephrine that you are currently taking. You can continue the bedtime dose of benadryl, if needed. I also recommend use of Mucinex-DM 12 hour pill twice daily. This has an expectorant to loosen the mucus, and a cough suppressant. You can consider starting Flonase, and using this nasal steroid spray regularly throughout the rest of allergy season. You can start this right away, overlapping by at least a week with the claritin D. If you are doing very well at that point, continue the flonase (if you're tolerating it), and you may be able to back off on the Claritin D and use then only when needed. Remember with flonase, you need to use it every day, gentle sniffs. Start with 2 sprays into each nostril, but you can back off to 1 spray if/when you're doing better.  Some people really like using sinus rinses to help with nasal congestion (neti-pot, sinus rinse kit, navage). Sometimes rinsing after being outside can rid your nose of some allergens faster.  You may use tylenol as needed for headache or sore throat.  Salt water gargles are fine too.

## 2023-05-13 NOTE — Progress Notes (Signed)
Chief Complaint  Patient presents with   Nasal Congestion    Started about 3 weeks ago with nasal congestion after falling asleep under a fan. Hacking up some green mucus. Did covid test this past weekend and it was negative. No fever. Taking OTC Sudafed and took some Claritin.    He has been battling allergies all Spring.  One time he fell asleep under the fan--he woke up stuffy and congested. This was 2.5-3 weeks ago, and he has felt worse since then. He has been having some bitemporal headaches, sore throat. He is coughing up a small amount of green phlegm in the morning, cough is nonproductive throughout the day.  Not blowing anything out of his nose. Currently not having any sinus pain. Cough comes in spells, hacky cough.  Cough drops help. Salt water gargles helped.  He took claritin for a couple of days, didn't help much. Uses benadryl every night. He has been using sudafed for the last week (phenylephrine, taking it 4x/day at least).  Denies any wheezing, shortness of breath.  He is wondering if he could have strep throat or an STD.  PMH, PSH, SH reviewed   Outpatient Encounter Medications as of 05/13/2023  Medication Sig Note   diphenhydrAMINE (BENADRYL) 25 mg capsule Take 50 mg by mouth every 6 (six) hours as needed. 05/13/2023: Took last night   phenylephrine (SUDAFED PE) 10 MG TABS tablet Take 20 mg by mouth every 4 (four) hours as needed. 05/13/2023: Took this am   [DISCONTINUED] doxycycline (ADOXA) 100 MG tablet Take 100 mg by mouth 2 (two) times daily.    [DISCONTINUED] MAXITROL 3.5-10000-0.1 OINT Place 1 Application into the right eye 3 (three) times daily.    No facility-administered encounter medications on file as of 05/13/2023.   No Known Allergies   ROS:  no f/c, n/v/d.  +headaches, sore throat and cough. No chest pain, shortness of breath.  Feels a little sluggish. No myalgias. No ear pain/plugging. See HPI   PHYSICAL EXAM:  BP 120/70   Pulse 72   Temp 97.7  F (36.5 C) (Tympanic)   Ht 6\' 1"  (1.854 m)   Wt 195 lb 12.8 oz (88.8 kg)   BMI 25.83 kg/m   Pleasant, well-appearing male in no distress. No coughing during visit HEENT: conjunctiva and sclera are clear, EOMI. TM's and EAc's normal Nasal mucosa is mild-moderately edematous with clear mucus. No erythema or purulence. Sinuses are nontender. OP is clear. No erythema, exudates, ulcerations or other mucosal abnormalities. Neck: no lymphadenopathy, thyromegaly or mass Heart: regular rate and rhythm, no murmur Lungs: clear bilaterally, no wheezes, rales, ronchi Neuro: alert and oriented, cranial nerves intact. Normal gait Psych: just slightly anxious, otherwise normal.   ASSESSMENT/PLAN:  Seasonal allergic rhinitis, unspecified trigger - no e/o sinusitis, reviewed s/sx, contact us if worse. Supportive measures reviewed in detail   Stay well hydrated. I don't see evidence of a bacterial infection at this point.  Please contact us if you develop worsening sinus pain, persistent discolored mucus (not just first thing in the morning).  I recommend taking claritin AND sudafed.  I suggest switching to a 24 hour Claritin-D, as this contains pseudoephedrine as the decongestant, which is more effective than the phenylephrine that you are currently taking. You can continue the bedtime dose of benadryl, if needed. I also recommend use of Mucinex-DM 12 hour pill twice daily. This has an expectorant to loosen the mucus, and a cough suppressant. You can consider starting Flonase, and using  this nasal steroid spray regularly throughout the rest of allergy season. You can start this right away, overlapping by at least a week with the claritin D. If you are doing very well at that point, continue the flonase (if you're tolerating it), and you may be able to back off on the Claritin D and use then only when needed. Remember with flonase, you need to use it every day, gentle sniffs. Start with 2 sprays into each  nostril, but you can back off to 1 spray if/when you're doing better.  Some people really like using sinus rinses to help with nasal congestion (neti-pot, sinus rinse kit, navage). Sometimes rinsing after being outside can rid your nose of some allergens faster.  You may use tylenol as needed for headache or sore throat.  Salt water gargles are fine too.

## 2023-09-06 LAB — HM DIABETES EYE EXAM

## 2023-09-08 ENCOUNTER — Encounter: Payer: Self-pay | Admitting: Internal Medicine

## 2024-01-05 ENCOUNTER — Ambulatory Visit: Payer: No Typology Code available for payment source | Admitting: Medical

## 2024-01-05 VITALS — BP 124/80 | HR 85 | Wt 205.0 lb

## 2024-01-05 DIAGNOSIS — H9201 Otalgia, right ear: Secondary | ICD-10-CM | POA: Diagnosis not present

## 2024-01-05 DIAGNOSIS — R42 Dizziness and giddiness: Secondary | ICD-10-CM | POA: Diagnosis not present

## 2024-01-05 DIAGNOSIS — H6991 Unspecified Eustachian tube disorder, right ear: Secondary | ICD-10-CM | POA: Diagnosis not present

## 2024-01-05 MED ORDER — PSEUDOEPHEDRINE HCL 60 MG PO TABS: 60.0000 mg | ORAL_TABLET | ORAL | 0 refills | Status: AC | PRN

## 2024-01-05 MED ORDER — FLUTICASONE PROPIONATE 50 MCG/ACT NA SUSP: 2.0000 | Freq: Every day | NASAL | 0 refills | Status: AC

## 2024-01-05 NOTE — Patient Instructions (Signed)
Symptoms suggest eustachian tube dysfunction  Recommendations: Drink at least 80-100 ounces water daily Consider Flonase nasal spray or Nasacort nasal spray over the counter daily for the next week Begin OTC Sudafed twice daily or up to 3 times daily, or similar sinus decongestant If needed, if stopped up in the nostrils, you can temporarily for 3 days or less use Afrin nasal spray over the counter short term Use Ibuprofen 200mg  OTC, 3 tablets 2-3 times daily for the next few days for inflammation and pain   Barotitis Media Barotitis media is soreness (inflammation) of the area behind the eardrum (middle ear). This occurs when the auditory tube (Eustachian tube) leading from the back of the throat to the eardrum is blocked. When it is blocked air cannot move in and out of the middle ear to equalize pressure changes. These pressure changes come from changes in altitude when: Flying.  Driving in the mountains.  Diving.  Problems are more likely to occur with pressure changes during times when you are congested as from: Hay fever.  Upper respiratory infection.  A cold.  Damage or hearing loss (barotrauma) caused by this may be permanent. HOME CARE INSTRUCTIONS  Use medicines as recommended by your caregiver. Over the counter medicines will help unblock the canal and can help during times of air travel.  Do not put anything into your ears to clean or unplug them. Eardrops will not be helpful.  Do not swim, dive, or fly until your caregiver says it is all right to do so. If these activities are necessary, chewing gum with frequent swallowing may help. It is also helpful to hold your nose and gently blow to pop your ears for equalizing pressure changes. This forces air into the Eustachian tube.  For little ones with problems, give your baby a bottle of water or juice during periods when pressure changes would be anticipated such as during take offs and landings associated with air travel.  Only  take over-the-counter or prescription medicines for pain, discomfort, or fever as directed by your caregiver.  A decongestant may be helpful in de-congesting the middle ear and make pressure equalization easier. This can be even more effective if the drops (spray) are delivered with the head lying over the edge of a bed with the head tilted toward the ear on the affected side.  If your caregiver has given you a follow-up appointment, it is very important to keep that appointment. Not keeping the appointment could result in a chronic or permanent injury, pain, hearing loss and disability. If there is any problem keeping the appointment, you must call back to this facility for assistance.  SEEK IMMEDIATE MEDICAL CARE IF:  You develop a severe headache, dizziness, severe ear pain, or bloody or pus-like drainage from your ears.  An oral temperature above 102 F (38.9 C) develops.  Your problems do not improve or become worse.  MAKE SURE YOU:  Understand these instructions.  Will watch your condition.  Will get help right away if you are not doing well or get worse.  Document Released: 11/27/2000 Document Revised: 08/12/2011 Document Reviewed: 07/05/2008 [facility] Patient Information 2012 [place], [place].

## 2024-01-05 NOTE — Progress Notes (Signed)
Subjective:  Gary Garrett is a 53 y.o. male who presents for Chief Complaint  Patient presents with   Dizziness    Dizziness, right ear feels full. Flew last week and when he returned he started having dizziness, not sure if its the elevation.      Here for c/o dizziness, right ear pressure.   Has been feeling this way a week.  Feeling dizzy in waves, intermittent.  Doing laundry the other day felt bad dizziness.  Hasn't taken anything for symptoms.  No sore throat, no runny nose, no sneezing.   Does blow nose with some mucous in morning.  No fever, body aches or chills, no sick feeling.    No palpations, no chest pain, no dyspnea, smart watch hasn't shown unusual heart rate.   No confusion, no numbness or tingling.    Was in [facility] a week ago.  Did a tour and went down on a deep elevator, but no other elevation changes.   No other aggravating or relieving factors.    No other c/o.   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     No current facility-administered medications on file prior to visit.     The following portions of the patient's history were reviewed and updated as appropriate: allergies, current medications, past family history, past medical history, past social history, past surgical history and problem list.  ROS Otherwise as in subjective above  Objective: BP 124/80   Pulse 85   Wt 205 lb (93 kg)   BMI 27.05 kg/m   Wt Readings from Last 3  Encounters:  01/05/24 205 lb (93 kg)  05/13/23 195 lb 12.8 oz (88.8 kg)  01/11/23 187 lb 9.6 oz (85.1 kg)    General appearance: alert, no distress, well developed, well nourished HEENT: normocephalic, sclerae anicteric, conjunctiva pink and moist, TMs pearly, left nare with some mucoid discharge and erythema, right nare normal, pharynx normal Oral cavity: MMM, no lesions Neck: supple, no lymphadenopathy, no thyromegaly, no masses Neuro: nonfocal exam Heart rrr normal s1, s2, no murmurs Lungs clear     Assessment: Encounter Diagnoses  Name Primary?   Eustachian tube disorder, right Yes   Right ear pain    Dizziness      Plan: We discussed exam findings.  We discussed eustachian tube dysfunction.  Symptoms suggest eustachian tube dysfunction  Recommendations: Drink at least 80-100 ounces water daily Consider Flonase nasal spray or Nasacort nasal spray over the counter daily for the next week Begin OTC Sudafed twice daily or up to 3 times daily, or similar sinus decongestant If needed, if stopped up in the nostrils, you can temporarily for 3 days or less use Afrin nasal spray over the counter short term Use Ibuprofen 200mg  OTC, 3 tablets 2-3 times daily for the next few days for inflammation and pain  Jaworski "[name]" was seen today for dizziness.  Diagnoses and all orders for this visit:  Eustachian tube disorder, right  Right ear pain  Dizziness  Other orders -     pseudoephedrine (SUDAFED) 60 MG tablet; Take 1 tablet (60 mg total) by mouth every 4 (four) hours as needed for congestion. -     fluticasone (FLONASE) 50 MCG/ACT nasal spray; Place 2 sprays into both nostrils daily.    Follow up: prn

## 2024-01-17 ENCOUNTER — Ambulatory Visit: Payer: No Typology Code available for payment source | Admitting: Medical

## 2024-01-17 ENCOUNTER — Encounter: Payer: Self-pay | Admitting: Medical

## 2024-01-17 VITALS — BP 120/70 | HR 77 | Ht 71.75 in | Wt 197.0 lb

## 2024-01-17 DIAGNOSIS — J4520 Mild intermittent asthma, uncomplicated: Secondary | ICD-10-CM

## 2024-01-17 DIAGNOSIS — Z125 Encounter for screening for malignant neoplasm of prostate: Secondary | ICD-10-CM

## 2024-01-17 DIAGNOSIS — Z0000 Encounter for general adult medical examination without abnormal findings: Secondary | ICD-10-CM | POA: Diagnosis not present

## 2024-01-17 DIAGNOSIS — Z13220 Encounter for screening for lipoid disorders: Secondary | ICD-10-CM | POA: Diagnosis not present

## 2024-01-17 DIAGNOSIS — Z113 Encounter for screening for infections with a predominantly sexual mode of transmission: Secondary | ICD-10-CM

## 2024-01-17 LAB — LIPID PANEL

## 2024-01-17 NOTE — Progress Notes (Signed)
Subjective:   HPI  Gary Garrett is a 53 y.o. male who presents for Chief Complaint  Patient presents with   Annual Exam    Fasting cpe, no concerns    Patient Care Team: Kynslie Ringle, Kermit Balo, PA-C as PCP - General (Family Medicine) Pa, [facility] Sees dentist Sees eye doctor [facility] Dr. Claudette Head, GI Dr. Myrtis Ser, oral surgery Prior Urology   Concerns: Was seen here few weeks ago for eustachian tube issues, some improvement so far.     Reviewed their medical, surgical, family, social, medication, and allergy history and updated chart as appropriate.  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 Claudette Head   SKIN LESION EXCISION     WISDOM TOOTH EXTRACTION      Family History  Problem Relation Age of Onset   Cancer Mother        breast cancer   Asthma Sister    Fibromyalgia Sister    Stroke Maternal Grandmother    Cancer Maternal Grandfather 59       prostate   Hypertension Paternal Grandmother    Heart disease Paternal Grandfather        died in 39s of heart disease   Cancer Other        great grandmother lung cancer   Diabetes Neg Hx    Colon cancer Neg Hx    Colon polyps Neg Hx    Esophageal cancer Neg Hx    Rectal cancer Neg Hx    Stomach cancer Neg Hx      Current Outpatient Medications:    diphenhydrAMINE (BENADRYL) 25 mg capsule, Take 50 mg by mouth every 6 (six) hours as needed., Disp: , Rfl:    fluticasone (FLONASE) 50 MCG/ACT nasal spray, Place 2 sprays into both nostrils daily., Disp: 16 g, Rfl: 0    pseudoephedrine (SUDAFED) 60 MG tablet, Take 1 tablet (60 mg total) by mouth every 4 (four) hours as needed for congestion., Disp: 30 tablet, Rfl: 0  No Known Allergies   Review of Systems  Constitutional:  Negative for chills, fever, malaise/fatigue and weight loss.  HENT:  Negative for congestion, ear pain, hearing loss, sore throat and tinnitus.   Eyes:  Negative for blurred vision, pain and redness.  Respiratory:  Negative for cough, hemoptysis and shortness of breath.   Cardiovascular:  Negative for chest pain, palpitations, orthopnea, claudication and leg swelling.  Gastrointestinal:  Negative for abdominal pain, blood in stool, constipation, diarrhea, nausea and vomiting.  Genitourinary:  Negative for dysuria, flank pain, frequency, hematuria and urgency.  Musculoskeletal:  Negative for falls, joint pain and myalgias.  Skin:  Negative for itching and rash.  Neurological:  Negative for dizziness, tingling, speech change, weakness and headaches.  Endo/Heme/Allergies:  Negative for polydipsia. Does not bruise/bleed easily.  Psychiatric/Behavioral:  Negative for depression and memory loss. The patient is not nervous/anxious and does not have insomnia.          Objective:  BP 120/70  Pulse 77   Ht 5' 11.75" (1.822 m)   Wt 197 lb (89.4 kg)   SpO2 98%   BMI 26.90 kg/m   Wt Readings from Last 3 Encounters:  01/17/24 197 lb (89.4 kg)  01/05/24 205 lb (93 kg)  05/13/23 195 lb 12.8 oz (88.8 kg)    General appearance: alert, no distress, WD/WN, Caucasian male Skin: no worrisome lesions HEENT: normocephalic, conjunctiva/corneas normal, sclerae anicteric, PERRLA, EOMi, nares patent, no discharge or erythema, pharynx normal Oral cavity: MMM, tongue normal, teeth in good repair Neck: supple, no lymphadenopathy, no thyromegaly, no masses, normal ROM, no bruits Chest: non tender, normal shape and expansion Heart: RRR, normal S1, S2, no murmurs Lungs: CTA bilaterally, no wheezes,  rhonchi, or rales Abdomen: +bs, soft, non tender, non distended, no masses, no hepatomegaly, no splenomegaly, no bruits Back: non tender, normal ROM, no scoliosis Musculoskeletal: upper extremities non tender, no obvious deformity, normal ROM throughout, lower extremities non tender, no obvious deformity, normal ROM throughout Extremities: no edema, no cyanosis, no clubbing Pulses: 2+ symmetric, upper and lower extremities, normal cap refill Neurological: alert, oriented x 3, CN2-12 intact, strength normal upper extremities and lower extremities, sensation normal throughout, DTRs 2+ throughout, no cerebellar signs, gait normal Psychiatric: normal affect, behavior normal, pleasant  GU -normal male, circ, no lesions, no hernia rectal - prostate wnl, no nodules, anus normal tone   Assessment and Plan :   Encounter Diagnoses  Name Primary?   Routine general medical examination at a health care facility Yes   Screen for STD (sexually transmitted disease)    Mild intermittent asthma, unspecified whether complicated    Screening for lipid disorders    Screening for prostate cancer     Today you had a preventative care visit or wellness visit.    Topics today may have included healthy lifestyle, diet, exercise, preventative care, vaccinations, sick and well care, proper use of emergency dept and after hours care, as well as other concerns.     Recommendations: Continue to return yearly for your annual wellness and preventative care visits.  This gives [place] a chance to discuss healthy lifestyle, exercise, vaccinations, review your chart record, and perform screenings where appropriate.  I recommend you see your eye doctor yearly for routine vision care.  I recommend you see your dentist yearly for routine dental care including hygiene visits twice yearly.   Vaccination recommendations were reviewed Immunization History  Administered Date(s) Administered   Hepatitis A 06/27/2002, 12/28/2002    Hepatitis B 06/27/2002, 07/25/2002, 12/28/2002   Influenza Inj Mdck Quad Pf 09/06/2019   Influenza Split 09/30/2011, 09/13/2021   Influenza,inj,Quad PF,6+ Mos 11/20/2013, 12/03/2014, 11/25/2015, 09/23/2016, 10/21/2017, 10/24/2018   Influenza-Unspecified 09/16/2022   PFIZER(Purple Top)SARS-COV-2 Vaccination 03/25/2020, 04/15/2020, 10/24/2020   Pfizer(Comirnaty)Fall Seasonal Vaccine 12 years and older 09/16/2022   Tdap 12/03/2014   Zoster Recombinant(Shingrix) 01/13/2021, 04/09/2021   Consider pneumococcal vaccine but he declines today   Screening for cancer: Colon cancer screening:  Reviewed colonoscopy Dr. Claudette Head 07/07/21, polyps, due repeat in 3 years, this summer 2025.  We discussed PSA, prostate exam, and prostate cancer screening risks/benefits.     Skin cancer screening: Check your skin regularly for new changes, growing lesions, or other lesions of concern Come in for evaluation if you have skin lesions of concern.  Lung cancer screening: If you have a greater than 30 pack year history of tobacco use, then you qualify for lung cancer screening with a chest CT scan  We currently don't  have screenings for other cancers besides breast, cervical, colon, and lung cancers.  If you have a strong family history of cancer or have other cancer screening concerns, please let me know.    Bone health: Get at least 150 minutes of aerobic exercise weekly Get weight bearing exercise at least once weekly   Heart health: Get at least 150 minutes of aerobic exercise weekly Limit alcohol It is important to maintain a healthy blood pressure and healthy cholesterol numbers  CT coronary calcium score of zero 01/20/23   Significant other issues: Asthma - no recent problems    Gary "[name]" was seen today for annual exam.  Diagnoses and all orders for this visit:  Routine general medical examination at a health care facility -     Comprehensive metabolic panel -     CBC -      PSA -     Lipid panel -     RPR+HIV+GC+CT Panel -     Hepatitis C antibody -     Hepatitis B surface antigen  Screen for STD (sexually transmitted disease) -     RPR+HIV+GC+CT Panel -     Hepatitis C antibody -     Hepatitis B surface antigen  Mild intermittent asthma, unspecified whether complicated  Screening for lipid disorders -     Lipid panel  Screening for prostate cancer -     PSA    Follow-up pending labs, yearly for physical

## 2024-01-18 NOTE — Progress Notes (Signed)
 Results sent through MyChart

## 2024-01-19 LAB — RPR+HIV+GC+CT PANEL

## 2024-01-19 LAB — HEPATITIS B SURFACE ANTIGEN

## 2024-01-19 LAB — COMPREHENSIVE METABOLIC PANEL
ALT: 24 IU/L (ref 0–44)
AST: 19 IU/L (ref 0–40)
Albumin: 4.7 g/dL (ref 3.8–4.9)
Alkaline Phosphatase: 55 IU/L (ref 44–121)
BUN/Creatinine Ratio: 16 (ref 9–20)
BUN: 14 mg/dL (ref 6–24)
Bilirubin Total: 0.3 mg/dL (ref 0.0–1.2)
CO2: 21 mmol/L (ref 20–29)
Calcium: 9.2 mg/dL (ref 8.7–10.2)
Chloride: 107 mmol/L — ABNORMAL HIGH (ref 96–106)
Creatinine, Ser: 0.89 mg/dL (ref 0.76–1.27)
Globulin, Total: 2.1 g/dL (ref 1.5–4.5)
Glucose: 102 mg/dL — ABNORMAL HIGH (ref 70–99)
Potassium: 4.4 mmol/L (ref 3.5–5.2)
Sodium: 144 mmol/L (ref 134–144)
Total Protein: 6.8 g/dL (ref 6.0–8.5)
eGFR: 102 mL/min/{1.73_m2} (ref >59)

## 2024-01-19 LAB — CBC
Hematocrit: 44.1 % (ref 37.5–51.0)
Hemoglobin: 14.6 g/dL (ref 13.0–17.7)
MCH: 28.7 pg (ref 26.6–33.0)
MCHC: 33.1 g/dL (ref 31.5–35.7)
MCV: 87 fL (ref 79–97)
Platelets: 263 10*3/uL (ref 150–450)
RBC: 5.09 x10E6/uL (ref 4.14–5.80)
RDW: 13 % (ref 11.6–15.4)
WBC: 3.8 10*3/uL (ref 3.4–10.8)

## 2024-01-19 LAB — LIPID PANEL
Cholesterol, Total: 209 mg/dL — ABNORMAL HIGH (ref 100–199)
HDL: 38 mg/dL — ABNORMAL LOW (ref >39)
LDL CALC COMMENT:: 5.5 ratio — ABNORMAL HIGH (ref 0.0–5.0)
LDL Chol Calc (NIH): 156 mg/dL — ABNORMAL HIGH (ref 0–99)
Triglycerides: 83 mg/dL (ref 0–149)
VLDL Cholesterol Cal: 15 mg/dL (ref 5–40)

## 2024-01-19 LAB — PSA: Prostate Specific Ag, Serum: 0.7 ng/mL (ref 0.0–4.0)

## 2024-01-19 LAB — HEPATITIS C ANTIBODY

## 2024-03-18 ENCOUNTER — Ambulatory Visit
Admission: RE | Admit: 2024-03-18 | Discharge: 2024-03-18 | Disposition: A | Discharge status: Home / Self Care | Payer: Self-pay | Source: Ambulatory Visit | Attending: Family Medicine | Admitting: Family Medicine

## 2024-03-18 VITALS — BP 126/83 | HR 65 | Temp 97.7°F | Resp 18

## 2024-03-18 DIAGNOSIS — L03011 Cellulitis of right finger: Secondary | ICD-10-CM

## 2024-03-18 MED ORDER — SULFAMETHOXAZOLE-TRIMETHOPRIM 800-160 MG PO TABS: 1.0000 | ORAL_TABLET | Freq: Two times a day (BID) | ORAL | 0 refills | Status: AC

## 2024-03-18 NOTE — ED Provider Notes (Signed)
 [name]    CSN: [id number] Arrival date & time: 03/18/24  4098      History   Chief Complaint Chief Complaint  Patient presents with   Finger Injury    Hangnail, fear MRSA - Entered by patient    HPI Gary Garrett is a 53 y.o. male.   HPI Hangnail redness swelling pain right fifth finger x 1 week treating it with warm soaks, concerned about MRSA has had MRSA in the past.  He is a landscaper.  Denies fever, chills, sweats, red streaks, drainage.  Admits pain.   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 Claudette Head   SKIN LESION EXCISION     WISDOM TOOTH EXTRACTION         Home Medications    Prior to Admission medications   Medication Sig Start Date End Date Taking? Authorizing Provider  sulfamethoxazole-trimethoprim (BACTRIM DS) 800-160 MG tablet Take 1 tablet by mouth 2 (two) times daily for 7 days. 03/18/24 03/25/24 Yes Meliton Rattan, PA  diphenhydrAMINE (BENADRYL) 25 mg  capsule Take 50 mg by mouth every 6 (six) hours as needed.    [provider]  fluticasone (FLONASE) 50 MCG/ACT nasal spray Place 2 sprays into both nostrils daily. 01/05/24   Tysinger, Kermit Balo, PA-C  pseudoephedrine (SUDAFED) 60 MG tablet Take 1 tablet (60 mg total) by mouth every 4 (four) hours as needed for congestion. 01/05/24   Tysinger, Kermit Balo, PA-C    Family History Family History  Problem Relation Age of Onset   Cancer Mother        breast cancer   Asthma Sister    Fibromyalgia Sister    Stroke Maternal Grandmother    Cancer Maternal Grandfather 49       prostate   Hypertension Paternal Grandmother    Heart disease Paternal Grandfather        died in 67s of heart disease   Cancer Other        great grandmother lung cancer   Diabetes Neg Hx    Colon cancer Neg Hx    Colon polyps Neg Hx    Esophageal cancer Neg Hx    Rectal cancer Neg Hx    Stomach cancer Neg Hx     Social History Social History   Tobacco Use   Smoking status: Former    Current packs/day: 0.00    Average packs/day: 0.3  packs/day for 2.0 years (0.5 ttl pk-yrs)    Types: Cigarettes    Start date: 03/28/2009    Quit date: 03/29/2011    Years since quitting: 12.9    Passive exposure: Never   Smokeless tobacco: Never  Vaping Use   Vaping status: Never Used  Substance Use Topics   Alcohol use: Not Currently    Alcohol/week: 0.0 standard drinks of alcohol    Comment: occasionally   Drug use: No     Allergies   Patient has no known allergies.   Review of Systems Review of Systems   Physical Exam Triage Vital Signs ED Triage Vitals  Encounter Vitals Group     BP 03/18/24 0913 126/83     Systolic BP Percentile --      Diastolic BP Percentile --      Pulse Rate 03/18/24 0913 65     Resp 03/18/24 0913 18     Temp 03/18/24 0913 97.7 F (36.5 C)     Temp Source 03/18/24 0913 Oral     SpO2 03/18/24 0913 97 %     Weight --      Height --      Head Circumference --      Peak Flow  --      Pain Score 03/18/24 0912 0     Pain Loc --      Pain Education --      Exclude from Growth Chart --    No data found.  Updated Vital Signs BP 126/83 (BP Location: Right Arm)   Pulse 65   Temp 97.7 F (36.5 C) (Oral)   Resp 18   SpO2 97%   Visual Acuity Right Eye Distance:   Left Eye Distance:   Bilateral Distance:    Right Eye Near:   Left Eye Near:    Bilateral Near:     Physical Exam Vitals reviewed.  Constitutional:      Appearance: He is not ill-appearing.  Cardiovascular:     Rate and Rhythm: Normal rate.  Pulmonary:     Effort: Pulmonary effort is normal. No respiratory distress.  Skin:    General: Skin is warm and dry.     Findings: Erythema present.     Comments: Paronychia right fifth finger local erythema swelling and tenderness,  Neurological:     Mental Status: He is alert.      UC Treatments / Results  Labs (all labs ordered are listed, but only abnormal results are displayed) Labs Reviewed - No data to display  EKG   Radiology No results found.  Procedures Procedures (including critical care time)  Medications Ordered in UC Medications - No data to display  Initial Impression / Assessment and Plan / UC Course  I have reviewed the triage vital signs and the nursing notes.  Pertinent labs & imaging results that were available during my care of the patient were reviewed by me and considered in my medical decision making (see chart for details).     Discussed with patient do not see purulent collection however can attempt to drain paronychia he agrees, skin prep, small cut made with sterile 18-gauge needle no purulent drainage just blood.  Area cleaned, antibiotic ointment and Band-Aids applied, Rx Bactrim sent to pharmacy, recommend continued warm soaks, OTC NSAIDs for pain and elevation to reduce throbbing.  Follow-up as needed Final Clinical Impressions(s) / UC Diagnoses   Final diagnoses:  Pyonychia of finger of right hand  Discharge Instructions      Continue warm soaks   ED Prescriptions     Medication Sig Dispense Auth. Provider   sulfamethoxazole-trimethoprim (BACTRIM DS) 800-160 MG tablet Take 1 tablet by mouth 2 (two) times daily for 7 days. 14 tablet Meliton Rattan, [place]      PDMP not reviewed this encounter.   Meliton Rattan, [place] 03/18/24 1008

## 2024-03-18 NOTE — ED Triage Notes (Signed)
 Patient presents to UC for possible right pinky finger infection x 1 week. Reports he bites his nail. Hx of MRSA. Treating with epsom salt soaks.

## 2024-03-18 NOTE — Discharge Instructions (Addendum)
Continue warm soaks

## 2024-03-20 ENCOUNTER — Ambulatory Visit: Admitting: Medical

## 2024-03-27 ENCOUNTER — Encounter: Payer: Self-pay | Admitting: Gastroenterology

## 2024-04-04 ENCOUNTER — Ambulatory Visit: Payer: Self-pay | Admitting: *Deleted

## 2024-04-04 NOTE — Telephone Encounter (Signed)
 Copied from CRM [id number]. Topic: Clinical - Red Word Triage >> Apr 04, 2024  1:12 PM Oddis Bench wrote: Red Word that prompted transfer to Nurse Triage: Patient is calling bc he has rash the is red swollen and spreading Reason for Disposition  [1] Severe localized itching AND [2] after 2 days of steroid cream  Answer Assessment - Initial Assessment Questions 1. APPEARANCE of RASH: "Describe the rash."      I have a rash that I can't get rid of.   I was in mulch on Sat.   I noticed it Sunday.   I think it's poison Ivy.   I'm allergic to it. 2. LOCATION: "Where is the rash located?"      My arm. I had on a short sleeved shirt.   3. NUMBER: "How many spots are there?"      On my arm 4. SIZE: "How big are the spots?" (Inches, centimeters or compare to size of a coin)      Poison IVy 5. ONSET: "When did the rash start?"      Sun.   6. ITCHING: "Does the rash itch?" If Yes, ask: "How bad is the itch?"  (Scale 0-10; or none, mild, moderate, severe)     Yes  7. PAIN: "Does the rash hurt?" If Yes, ask: "How bad is the pain?"  (Scale 0-10; or none, mild, moderate, severe)    - NONE (0): no pain    - MILD (1-3): doesn't interfere with normal activities     - MODERATE (4-7): interferes with normal activities or awakens from sleep     - SEVERE (8-10): excruciating pain, unable to do any normal activities     No pain just itching I take Benadryl at night any way to help me sleep.   8. OTHER SYMPTOMS: "Do you have any other symptoms?" (e.g., fever)     No 9. PREGNANCY: "Is there any chance you are pregnant?" "When was your last menstrual period?"     N/A  Protocols used: Rash or Redness - Localized-A-AH  Chief Complaint: poison ivy on arm Symptoms: itchy rash after working in [employer] on Sat. Frequency: Noticed on Sun. Pertinent Negatives: Patient denies OTC antihistamines working Disposition: [] ED /[] Urgent Care (no appt availability in office) / [x] Appointment(In office/virtual)/ []  [place]  Virtual Care/ [] Home Care/ [] Refused Recommended Disposition /[] [place] [facility]/ []  Follow-up with PCP Additional Notes: Appt made for 4/24 with Dr Monnie Anthony.

## 2024-04-05 ENCOUNTER — Ambulatory Visit: Admitting: Medical

## 2024-04-05 VITALS — BP 110/70 | HR 80 | Wt 202.6 lb

## 2024-04-05 DIAGNOSIS — L237 Allergic contact dermatitis due to plants, except food: Secondary | ICD-10-CM | POA: Diagnosis not present

## 2024-04-05 MED ORDER — BETAMETHASONE VALERATE 0.1 % EX CREA: TOPICAL_CREAM | Freq: Two times a day (BID) | CUTANEOUS | 0 refills | Status: DC

## 2024-04-05 MED ORDER — METHYLPREDNISOLONE ACETATE 40 MG/ML IJ SUSP
40.0000 mg | Freq: Once | INTRAMUSCULAR | Status: AC
  Administered 2024-04-05: 40 mg via INTRAMUSCULAR

## 2024-04-05 NOTE — Progress Notes (Signed)
 Subjective:  Gary Garrett is a 53 y.o. male who presents for Chief Complaint  Patient presents with   Poison Ivy    Poison ivy on legs, back and right arm.      Here for poison ivy rash.  He was doing some yard work this past weekend about 4 days ago.  He has had rash on the right forearm, left buttock, left leg.  He is using prescription triamcinolone  cream he had leftover but is not working.  He is also taking Benadryl at night and has not really helping all that much.  No other new exposures.  No other aggravating or relieving factors.    No other c/o.   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     fluticasone  (FLONASE ) 50 MCG/ACT nasal spray Place 2 sprays into both nostrils daily. 16 g 0   pseudoephedrine  (SUDAFED) 60 MG tablet Take 1 tablet (60 mg total) by mouth every 4 (four) hours as needed for congestion. 30 tablet 0   No current facility-administered medications on file prior to visit.    The following portions of the patient's history were reviewed and updated as appropriate: allergies, current medications, past family history, past medical history, past social history, past surgical history and problem list.  ROS Otherwise as in subjective above     Objective: BP 110/70   Pulse 80   Wt 202 lb 9.6 oz (91.9 kg)   BMI 27.67 kg/m   General appearance: alert, no distress, well developed, well nourished Skin: Right forearm posterior midshaft with 1 cm  papular urticaria rash, similar rash on the anterior side as well, no other rash similar urticaria on left leg   Assessment: Encounter Diagnosis  Name Primary?   Poison ivy dermatitis Yes     Plan: Stop triamcinolone  cream and try the stronger betamethasone  cream below for the next few days.   Do not use this more than a week at a time.  Do not put this cream on your face.  You can use over-the-counter Benadryl once or twice a day to help with itching and rash.  Depo-Medrol  40 mg IM injection given today at his request  If not resolved or much improved within the next 3 days and call or recheck  Royston CorneaJosiah Nigh" was seen today for poison ivy.  Diagnoses and all orders for this visit:  Poison ivy dermatitis -     methylPREDNISolone  acetate (DEPO-MEDROL ) injection 40 mg  Other orders -     betamethasone  valerate (VALISONE ) 0.1 % cream; Apply topically 2 (two) times daily.    Follow up: prn

## 2024-04-06 ENCOUNTER — Ambulatory Visit: Admitting: Family Medicine

## 2024-06-26 ENCOUNTER — Encounter

## 2024-07-10 ENCOUNTER — Encounter: Admitting: Gastroenterology

## 2024-07-11 ENCOUNTER — Encounter: Payer: Self-pay | Admitting: Gastroenterology

## 2024-07-11 ENCOUNTER — Ambulatory Visit (AMBULATORY_SURGERY_CENTER): Admitting: *Deleted

## 2024-07-11 VITALS — Ht 72.0 in | Wt 195.0 lb

## 2024-07-11 DIAGNOSIS — Z860100 Personal history of colon polyps, unspecified: Secondary | ICD-10-CM

## 2024-07-11 MED ORDER — NA SULFATE-K SULFATE-MG SULF 17.5-3.13-1.6 GM/177ML PO SOLN: 1.0000 | Freq: Once | ORAL | 0 refills | Status: AC

## 2024-07-11 NOTE — Progress Notes (Signed)
 Pt's name and DOB verified at the beginning of the pre-visit wit 2 identifiers   Pt denies any difficulty with ambulating,sitting, laying down or rolling side to side  Pt has no issues moving head neck or swallowing  No egg or soy allergy known to patient   No issues known to pt with past sedation with any surgeries or procedures  Patient denies ever being intubated  No FH of Malignant Hyperthermia  Pt is not on home 02   Pt is not on blood thinners   Pt denies issues with constipation   Pt is not on dialysis  Pt denise any abnormal heart rhythms   Pt denies any upcoming cardiac testing  Patient's chart reviewed by Norleen Schillings CNRA prior to pre-visit and patient appropriate for the [facility].  Pre-visit completed and red dot placed by patient's name on their procedure day (on provider's schedule).    Visit by phone  Pt states weight is 195 lb  IInstructions reviewed. Pt given  both [facility] main # and MD on call # prior to instructions.  Pt states understanding of instructions. Instructed pt to review instructions again prior to procedure and call main # given if has questions.. Pt states they will.   Instructed pt on where to find instructions on My Chart.

## 2024-07-17 ENCOUNTER — Encounter: Payer: Self-pay | Admitting: Gastroenterology

## 2024-07-17 ENCOUNTER — Ambulatory Visit (AMBULATORY_SURGERY_CENTER): Admitting: Gastroenterology

## 2024-07-17 VITALS — BP 119/71 | HR 60 | Temp 98.1°F | Resp 16 | Ht 72.0 in | Wt 195.0 lb

## 2024-07-17 DIAGNOSIS — Z860100 Personal history of colon polyps, unspecified: Secondary | ICD-10-CM

## 2024-07-17 DIAGNOSIS — Z860101 Personal history of adenomatous and serrated colon polyps: Secondary | ICD-10-CM | POA: Diagnosis not present

## 2024-07-17 DIAGNOSIS — K648 Other hemorrhoids: Secondary | ICD-10-CM

## 2024-07-17 DIAGNOSIS — Z1211 Encounter for screening for malignant neoplasm of colon: Secondary | ICD-10-CM

## 2024-07-17 DIAGNOSIS — K641 Second degree hemorrhoids: Secondary | ICD-10-CM

## 2024-07-17 DIAGNOSIS — K621 Rectal polyp: Secondary | ICD-10-CM | POA: Diagnosis not present

## 2024-07-17 DIAGNOSIS — D128 Benign neoplasm of rectum: Secondary | ICD-10-CM

## 2024-07-17 MED ORDER — SODIUM CHLORIDE 0.9 % IV SOLN: 500.0000 mL | Freq: Once | INTRAVENOUS | Status: DC

## 2024-07-17 NOTE — Op Note (Signed)
 [facility] Patient Name: Gary Garrett Procedure Date: 07/17/2024 1:43 PM MRN: [id number] Endoscopist: Sandor Flatter , MD, [id number] Age: 53 Referring MD:  Date of Birth: Jan 11, 1971 Gender: Male Account #: [id number] Procedure:                Colonoscopy Indications:              High risk colon cancer surveillance: Personal                            history of sessile serrated colon polyp (less than                            10 mm in size) with no dysplasia                           Last colonoscopy was 06/2021 and notable for 3                            subcentimeter sessile serrated polyps, internal                            hemorrhoids, with recommendation to repeat in 3                            years. Medicines:                Monitored Anesthesia Care Procedure:                Pre-Anesthesia Assessment:                           - Prior to the procedure, a History and Physical                            was performed, and patient medications and                            allergies were reviewed. The patient's tolerance of                            previous anesthesia was also reviewed. The risks                            and benefits of the procedure and the sedation                            options and risks were discussed with the patient.                            All questions were answered, and informed consent                            was obtained. Prior Anticoagulants: The patient has  taken no anticoagulant or antiplatelet agents. ASA                            Grade Assessment: II - A patient with mild systemic                            disease. After reviewing the risks and benefits,                            the patient was deemed in satisfactory condition to                            undergo the procedure.                           After obtaining informed consent, the colonoscope                            was  passed under direct vision. Throughout the                            procedure, the patient's blood pressure, pulse, and                            oxygen saturations were monitored continuously. The                            Olympus CF-HQ190L ([id number]) Colonoscope was                            introduced through the anus and advanced to the the                            terminal ileum. The colonoscopy was performed                            without difficulty. The patient tolerated the                            procedure well. The quality of the bowel                            preparation was good. The terminal ileum, ileocecal                            valve, appendiceal orifice, and rectum were                            photographed. Scope In: 1:52:29 PM Scope Out: 2:08:34 PM Scope Withdrawal Time: 0 hours 12 minutes 59 seconds  Total Procedure Duration: 0 hours 16 minutes 5 seconds  Findings:                 The perianal and digital rectal examinations were  normal.                           A 2 mm polyp was found in the rectum. The polyp was                            sessile. The polyp was removed with a cold snare.                            Resection and retrieval were complete. Estimated                            blood loss was minimal.                           Non-bleeding internal hemorrhoids were found during                            retroflexion. The hemorrhoids were small.                           The exam was otherwise normal throughout the                            remainder of the colon.                           The terminal ileum appeared normal. Complications:            No immediate complications. Estimated Blood Loss:     Estimated blood loss was minimal. Impression:               - One 2 mm polyp in the rectum, removed with a cold                            snare. Resected and retrieved.                           -  Non-bleeding internal hemorrhoids.                           - The examined portion of the ileum was normal. Recommendation:           - Patient has a contact number available for                            emergencies. The signs and symptoms of potential                            delayed complications were discussed with the                            patient. Return to normal activities tomorrow.                            Written discharge instructions were provided  to the                            patient.                           - Resume previous diet.                           - Continue present medications.                           - Await pathology results.                           - Repeat colonoscopy for surveillance based on                            pathology results.                           - Return to GI office PRN. Sandor Flatter, MD 07/17/2024 2:15:18 PM

## 2024-07-17 NOTE — Progress Notes (Signed)
 GASTROENTEROLOGY PROCEDURE H&P NOTE   Primary Care Physician: Bulah Alm RAMAN, PA-C    Reason for Procedure:  Colon polyp surveillance  Plan:    Colonoscopy  Patient is appropriate for endoscopic procedure(s) in the ambulatory ([facility]) setting.  The nature of the procedure, as well as the risks, benefits, and alternatives were carefully and thoroughly reviewed with the patient. Ample time for discussion and questions allowed. The patient understood, was satisfied, and agreed to proceed.     HPI: Gary Garrett is a 53 y.o. male who presents for colonoscopy for ongoing colon polyp surveillance and colon cancer screening.  No active GI symptoms.  No known family history of colon cancer or related malignancy.  Patient is otherwise without complaints or active issues today.  Last colonoscopy was 06/2021 and notable for 3 subcentimeter sessile serrated polyps, internal hemorrhoids, with recommendation to repeat in 3 years.  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 Gwendlyn Buddy   SKIN LESION EXCISION     WISDOM TOOTH EXTRACTION      Prior to Admission medications   Medication Sig Start Date End Date Taking? Authorizing Provider  diphenhydrAMINE (BENADRYL) 25 mg capsule Take 50 mg by mouth every 6 (six) hours as needed.   Yes [provider]  fluticasone  (FLONASE ) 50 MCG/ACT nasal spray Place 2 sprays into both nostrils daily. Patient taking differently: Place 2 sprays into both nostrils as needed. 01/05/24   Tysinger, Alm RAMAN, PA-C  pseudoephedrine  (SUDAFED) 60 MG tablet Take 1 tablet (60 mg total) by mouth  every 4 (four) hours as needed for congestion. 01/05/24   Tysinger, Alm RAMAN, PA-C    Current Outpatient Medications  Medication Sig Dispense Refill   diphenhydrAMINE (BENADRYL) 25 mg capsule Take 50 mg by mouth every 6 (six) hours as needed.     fluticasone  (FLONASE ) 50 MCG/ACT nasal spray Place 2 sprays into both nostrils daily. (Patient taking differently: Place 2 sprays into both nostrils as needed.) 16 g 0   pseudoephedrine  (SUDAFED) 60 MG tablet Take 1 tablet (60 mg total) by mouth every 4 (four) hours as needed for congestion. 30 tablet 0   Current Facility-Administered Medications  Medication Dose Route Frequency Provider Last Rate Last Admin   0.9 %  sodium chloride  infusion  500 mL Intravenous Once Dawnisha Marquina V, DO        Allergies as of 07/17/2024   (No Known Allergies)    Family History  Problem Relation Age of Onset   Cancer Mother        breast cancer   Asthma Sister    Fibromyalgia Sister    Stroke Maternal Grandmother    Cancer Maternal Grandfather 50       prostate   Hypertension Paternal Grandmother    Heart disease Paternal Grandfather        died in 70s of heart disease   Cancer Other        great grandmother lung cancer   Diabetes Neg Hx    Colon cancer Neg Hx  Colon polyps Neg Hx    Esophageal cancer Neg Hx    Rectal cancer Neg Hx    Stomach cancer Neg Hx     Social History   Socioeconomic History   Marital status: Single    Spouse name: Not on file   Number of children: Not on file   Years of education: Not on file   Highest education level: Not on file  Occupational History   Occupation: Secondary school teacher: [employer]  Tobacco Use   Smoking status: Former    Current packs/day: 0.00    Average packs/day: 0.3 packs/day for 2.0 years (0.5 ttl pk-yrs)    Types: Cigarettes    Start date: 03/28/2009    Quit date: 03/29/2011    Years since quitting: 13.3    Passive exposure: Never   Smokeless tobacco: Never   Vaping Use   Vaping status: Never Used  Substance and Sexual Activity   Alcohol use: Not Currently    Alcohol/week: 0.0 standard drinks of alcohol    Comment: occasionally   Drug use: No   Sexual activity: Not on file  Other Topics Concern   Not on file  Social History Narrative   Lives at home by himself, single.  Exercise - intermittent, but does do stretching, walking, running, bike, weights.  Mult partners, MSM, condom use.  Tax inspector, [employer].  12/2023   Social Drivers of Health   Financial Resource Strain: Low Risk  (01/17/2024)   Overall Financial Resource Strain (CARDIA)    Difficulty of Paying Living Expenses: Not very hard  Food Insecurity: No Food Insecurity (01/17/2024)   Hunger Vital Sign    Worried About Running Out of Food in the Last Year: Never true    Ran Out of Food in the Last Year: Never true  Transportation Needs: No Transportation Needs (01/17/2024)   PRAPARE - Administrator, Civil Service (Medical): No    Lack of Transportation (Non-Medical): No  Physical Activity: Insufficiently Active (01/17/2024)   Exercise Vital Sign    Days of Exercise per Week: 5 days    Minutes of Exercise per Session: 10 min  Stress: Stress Concern Present (01/17/2024)   [employer] of Occupational Health - Occupational Stress Questionnaire    Feeling of Stress : Rather much  Social Connections: Unknown (01/17/2024)   Social Connection and Isolation Panel    Frequency of Communication with Friends and Family: More than three times a week    Frequency of Social Gatherings with Friends and Family: More than three times a week    Attends Religious Services: Patient declined    Database administrator or Organizations: No    Attends Banker Meetings: Never    Marital Status: Living with partner  Intimate Partner Violence: Not At Risk (01/17/2024)   Humiliation, Afraid, Rape, and Kick questionnaire    Fear of Current or Ex-Partner: No     Emotionally Abused: No    Physically Abused: No    Sexually Abused: No    Physical Exam: Vital signs in last 24 hours: @BP  123/79   Pulse 69   Temp 98.1 F (36.7 C)   Ht 6' (1.829 m)   Wt 195 lb (88.5 kg)   SpO2 98%   BMI 26.45 kg/m  GEN: NAD EYE: Sclerae anicteric ENT: MMM CV: Non-tachycardic Pulm: CTA b/l GI: Soft, NT/ND NEURO:  Alert & Oriented x 3   Arnez Stoneking, DO [facility]   07/17/2024 1:20 PM

## 2024-07-17 NOTE — Patient Instructions (Addendum)
 Resume previous diet Continue present medications Await pathology results Handouts/information given for polyps and hemorrhoids  YOU HAD AN ENDOSCOPIC PROCEDURE TODAY AT THE [facility]:   Refer to the procedure report that was given to you for any specific questions about what was found during the examination.  If the procedure report does not answer your questions, please call your gastroenterologist to clarify.  If you requested that your care partner not be given the details of your procedure findings, then the procedure report has been included in a sealed envelope for you to review at your convenience later.  YOU SHOULD EXPECT: Some feelings of bloating in the abdomen. Passage of more gas than usual.  Walking can help get rid of the air that was put into your GI tract during the procedure and reduce the bloating. If you had a lower endoscopy (such as a colonoscopy or flexible sigmoidoscopy) you may notice spotting of blood in your stool or on the toilet paper. If you underwent a bowel prep for your procedure, you may not have a normal bowel movement for a few days.  Please Note:  You might notice some irritation and congestion in your nose or some drainage.  This is from the oxygen used during your procedure.  There is no need for concern and it should clear up in a day or so.  SYMPTOMS TO REPORT IMMEDIATELY:  Following lower endoscopy (colonoscopy):  Excessive amounts of blood in the stool  Significant tenderness or worsening of abdominal pains  Swelling of the abdomen that is new, acute  Fever of 100F or higher  For urgent or emergent issues, a gastroenterologist can be reached at any hour by calling [phone]. Do not use MyChart messaging for urgent concerns.   DIET:  We do recommend a small meal at first, but then you may proceed to your regular diet.  Drink plenty of fluids but you should avoid alcoholic beverages for 24 hours.  ACTIVITY:  You should plan to take  it easy for the rest of today and you should NOT DRIVE or use heavy machinery until tomorrow (because of the sedation medicines used during the test).    FOLLOW UP: Our staff will call the number listed on your records the next business day following your procedure.  We will call around 7:15- 8:00 am to check on you and address any questions or concerns that you may have regarding the information given to you following your procedure. If we do not reach you, we will leave a message.     If any biopsies were taken you will be contacted by phone or by letter within the next 1-3 weeks.  Please call us at [phone] if you have not heard about the biopsies in 3 weeks.    SIGNATURES/CONFIDENTIALITY: You and/or your care partner have signed paperwork which will be entered into your electronic medical record.  These signatures attest to the fact that that the information above on your After Visit Summary has been reviewed and is understood.  Full responsibility of the confidentiality of this discharge information lies with you and/or your care-partner.

## 2024-07-17 NOTE — Progress Notes (Signed)
 A/o x 3, VSS, gd SR's, pleased with anesthesia, report to RN

## 2024-07-18 ENCOUNTER — Telehealth: Payer: Self-pay | Admitting: *Deleted

## 2024-07-18 NOTE — Telephone Encounter (Signed)
  Follow up Call-     07/17/2024    1:09 PM  Call back number  Post procedure Call Back phone  # [phone]  Permission to leave phone message Yes     Patient questions:  Do you have a fever, pain , or abdominal swelling? No. Pain Score  0 *  Have you tolerated food without any problems? Yes.    Have you been able to return to your normal activities? Yes.    Do you have any questions about your discharge instructions: Diet   No. Medications  No. Follow up visit  No.  Do you have questions or concerns about your Care? No.  Actions: * If pain score is 4 or above: No action needed, pain <4.

## 2024-07-19 ENCOUNTER — Ambulatory Visit: Payer: Self-pay | Admitting: Gastroenterology

## 2024-07-19 LAB — SURGICAL PATHOLOGY

## 2024-07-24 ENCOUNTER — Encounter: Admitting: Gastroenterology

## 2024-12-11 ENCOUNTER — Telehealth: Payer: Self-pay | Admitting: Internal Medicine

## 2024-12-11 ENCOUNTER — Other Ambulatory Visit: Payer: Self-pay | Admitting: Medical

## 2024-12-11 MED ORDER — SCOPOLAMINE 1 MG/3DAYS TD PT72: 1.0000 | MEDICATED_PATCH | TRANSDERMAL | 0 refills | Status: AC

## 2024-12-11 NOTE — Telephone Encounter (Signed)
 Pt was notified.

## 2024-12-11 NOTE — Telephone Encounter (Signed)
 Copied from CRM #[id number]. Topic: Clinical - Medication Question >> Dec 11, 2024 10:21 AM Hadassah PARAS wrote: Reason for CRM: Pt will be going on a cruise Thursday 01/01 and pt is req patches for sea sickness. Please advise on #[id number]  [id number] - [place], [place] - [address] [place] [address] Phone:[phone]:[phone]

## 2025-01-19 ENCOUNTER — Telehealth: Payer: Self-pay | Admitting: Internal Medicine

## 2025-01-19 ENCOUNTER — Other Ambulatory Visit: Payer: Self-pay | Admitting: Medical

## 2025-01-19 DIAGNOSIS — Z131 Encounter for screening for diabetes mellitus: Secondary | ICD-10-CM

## 2025-01-19 DIAGNOSIS — Z125 Encounter for screening for malignant neoplasm of prostate: Secondary | ICD-10-CM

## 2025-01-19 DIAGNOSIS — Z13220 Encounter for screening for lipoid disorders: Secondary | ICD-10-CM

## 2025-01-19 DIAGNOSIS — Z1389 Encounter for screening for other disorder: Secondary | ICD-10-CM

## 2025-01-19 DIAGNOSIS — Z113 Encounter for screening for infections with a predominantly sexual mode of transmission: Secondary | ICD-10-CM

## 2025-01-19 DIAGNOSIS — Z0000 Encounter for general adult medical examination without abnormal findings: Secondary | ICD-10-CM

## 2025-01-19 NOTE — Telephone Encounter (Signed)
 Labs have been entered, please call patient to schedule lab visit

## 2025-01-19 NOTE — Telephone Encounter (Signed)
 Copied from CRM [id number]. Topic: Clinical - Request for Lab/Test Order >> Jan 19, 2025 11:09 AM Wess RAMAN wrote: Reason for CRM: Patient needs lab order placed for upcoming annual physical and appt scheduled

## 2025-01-25 ENCOUNTER — Encounter: Payer: Self-pay | Admitting: Medical

## 2025-02-20 ENCOUNTER — Other Ambulatory Visit

## 2025-02-27 ENCOUNTER — Encounter: Admitting: Medical
